# Patient Record
Sex: Female | Born: 1950 | Race: White | Hispanic: No | Marital: Married | Smoking: Never smoker
Health system: Southern US, Community
[De-identification: ages and names within clinical notes are randomized; demographics above are authoritative.]

## PROBLEM LIST (undated history)

## (undated) DIAGNOSIS — E7800 Pure hypercholesterolemia, unspecified: Secondary | ICD-10-CM

## (undated) DIAGNOSIS — H409 Unspecified glaucoma: Secondary | ICD-10-CM

## (undated) DIAGNOSIS — L409 Psoriasis, unspecified: Secondary | ICD-10-CM

## (undated) DIAGNOSIS — M8580 Other specified disorders of bone density and structure, unspecified site: Secondary | ICD-10-CM

## (undated) HISTORY — DX: Psoriasis, unspecified: L40.9

## (undated) HISTORY — PX: COLONOSCOPY: SHX174

## (undated) HISTORY — DX: Pure hypercholesterolemia, unspecified: E78.00

## (undated) HISTORY — PX: OTHER SURGICAL HISTORY: SHX169

## (undated) HISTORY — DX: Other specified disorders of bone density and structure, unspecified site: M85.80

## (undated) HISTORY — DX: Unspecified glaucoma: H40.9

---

## 2004-12-08 ENCOUNTER — Emergency Department: Payer: Self-pay | Admitting: Internal Medicine

## 2005-06-23 ENCOUNTER — Inpatient Hospital Stay: Payer: Self-pay | Admitting: Internal Medicine

## 2005-07-03 ENCOUNTER — Inpatient Hospital Stay: Payer: Self-pay | Admitting: Internal Medicine

## 2005-07-03 IMAGING — CR DG CHEST 1V PORT
1 series · 1 of 1 positions shown · non-contrast
Comparison: none

REASON FOR EXAM: Chest pain
COMMENTS:

[view not recorded]
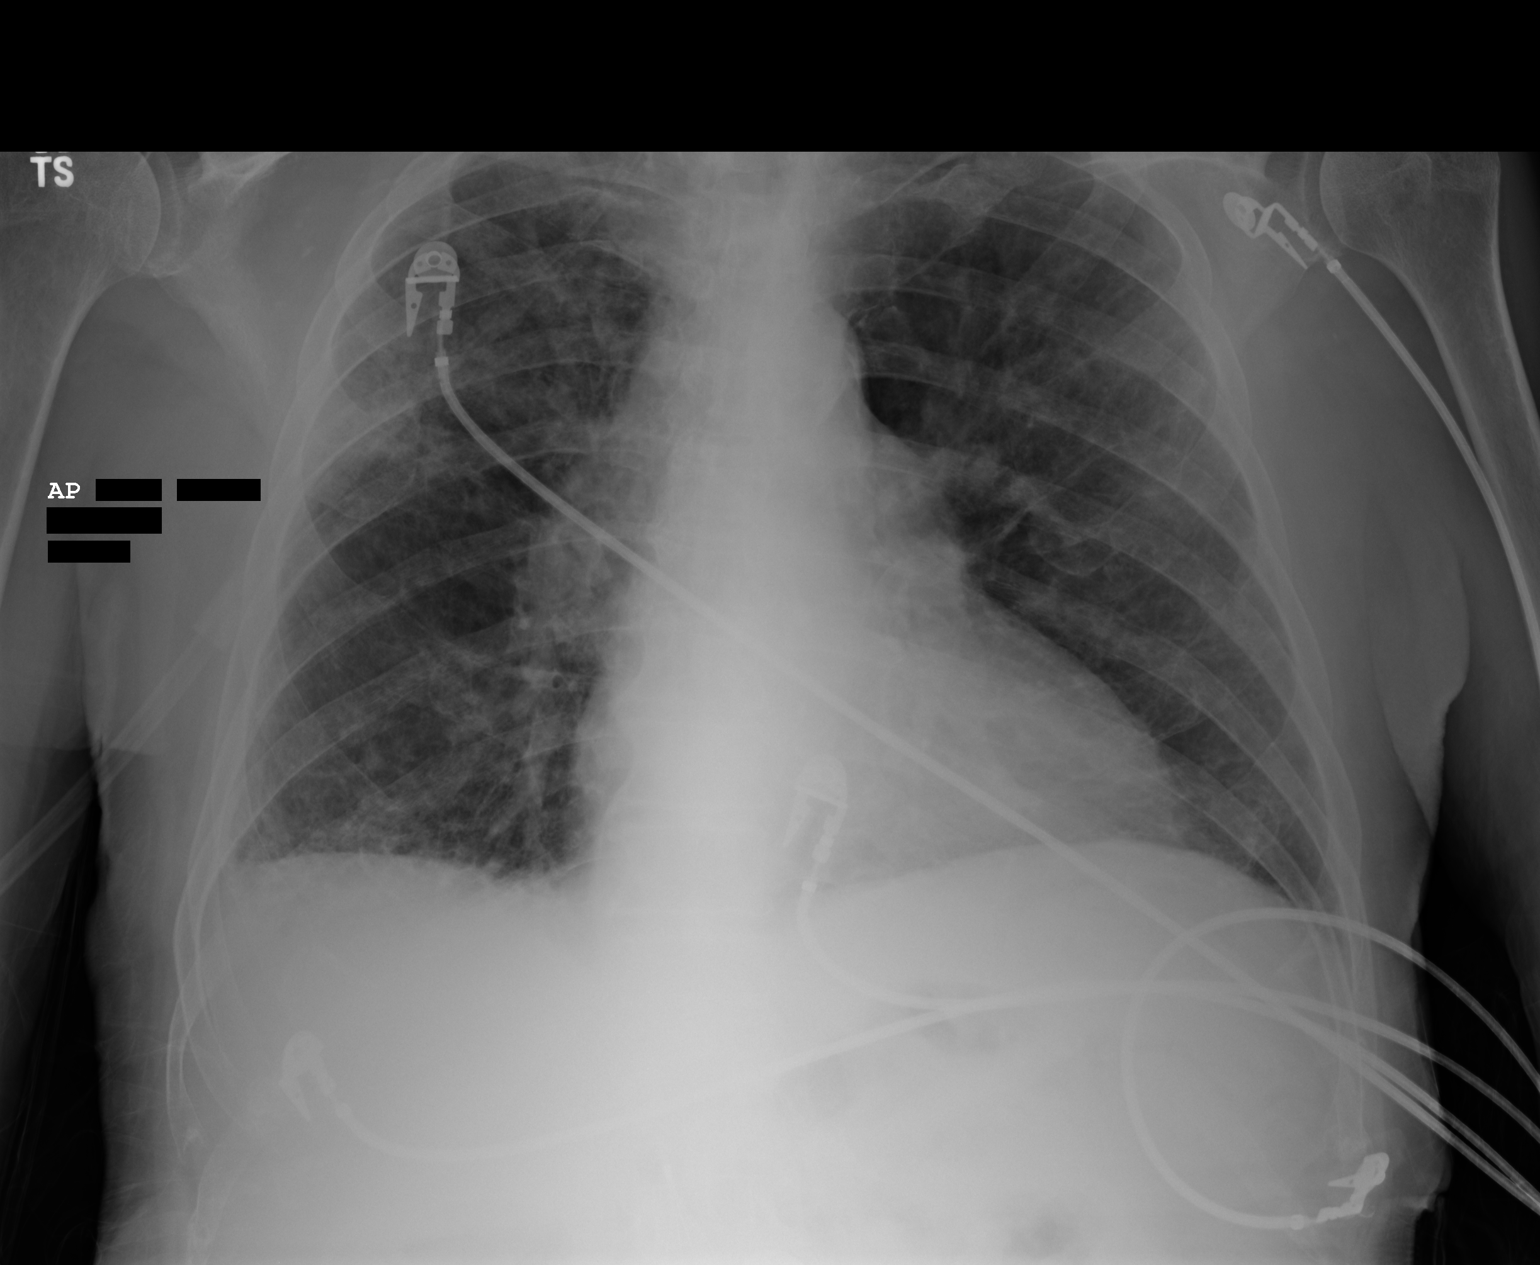

[1 of 1 positions shown; findings below may reference images not displayed]

PROCEDURE:     DXR - DXR PORTABLE CHEST SINGLE VIEW  - [DATE] [DATE]

RESULT:          Comparison is made to a study [DATE].

The lungs are well expanded.  The interstitial markings are less prominent
today, but they remain increased.  The cardiopericardial silhouette is
enlarged but stable.  The pulmonary vascularity is mildly prominent.  There
is no pleural effusion or pneumothorax.
IMPRESSION: There has been some improvement in the appearance of
the pulmonary interstitium since the prior study, but the findings likely
reflect an element of low-grade congestive heart failure superimposed upon
COPD.  There does appear to be some tortuosity of the descending thoracic
aorta.  Continued followup films are recommended.

## 2005-09-12 ENCOUNTER — Ambulatory Visit: Payer: Self-pay | Admitting: Family Medicine

## 2009-06-01 DIAGNOSIS — M8580 Other specified disorders of bone density and structure, unspecified site: Secondary | ICD-10-CM

## 2009-06-01 HISTORY — DX: Other specified disorders of bone density and structure, unspecified site: M85.80

## 2011-05-14 DIAGNOSIS — H409 Unspecified glaucoma: Secondary | ICD-10-CM

## 2011-05-14 HISTORY — DX: Unspecified glaucoma: H40.9

## 2018-03-06 ENCOUNTER — Ambulatory Visit (INDEPENDENT_AMBULATORY_CARE_PROVIDER_SITE_OTHER): Payer: Medicare Other | Admitting: Certified Nurse Midwife

## 2018-03-06 ENCOUNTER — Encounter: Payer: Self-pay | Admitting: Certified Nurse Midwife

## 2018-03-06 ENCOUNTER — Other Ambulatory Visit (HOSPITAL_COMMUNITY)
Admission: RE | Admit: 2018-03-06 | Discharge: 2018-03-06 | Disposition: A | Discharge status: Home / Self Care | Payer: Medicare Other | Source: Ambulatory Visit | Attending: Obstetrics and Gynecology | Admitting: Obstetrics and Gynecology

## 2018-03-06 VITALS — BP 100/60 | HR 72 | Ht 65.0 in | Wt 125.0 lb

## 2018-03-06 DIAGNOSIS — Z01419 Encounter for gynecological examination (general) (routine) without abnormal findings: Secondary | ICD-10-CM

## 2018-03-06 DIAGNOSIS — D126 Benign neoplasm of colon, unspecified: Secondary | ICD-10-CM | POA: Insufficient documentation

## 2018-03-06 DIAGNOSIS — Z124 Encounter for screening for malignant neoplasm of cervix: Secondary | ICD-10-CM

## 2018-03-06 DIAGNOSIS — Z1239 Encounter for other screening for malignant neoplasm of breast: Secondary | ICD-10-CM

## 2018-03-06 NOTE — Progress Notes (Signed)
Gynecology Annual Exam  PCP: Juluis Pitch, MD  Chief Complaint:  Chief Complaint  Patient presents with  . Gynecologic Exam    wants flu shot    History of Present Illness:Pamela Mendoza is a 67 year old Caucasian/White female, G3 P3003, who presents for her annual exam. She is having no significant GYN problems.  Her menses are absent and she is postmenopausal. She does not have hot flashes, night sweats, and vaginal dryness. She has had no spotting.   The patient's past medical history is notable for a history of hypercholesterolemia, osteopenia, and psoriasis.  Since her last annual GYN exam date. A reped 09/05/2015, she has had had two colonoscopies. Her colonoscopy 08/05/2015 she had a tubular adenomatous polyp with high grade dysplasia. She had another colonoscopy six months later in Sept 2019 and was advised to have her next colonoscopy in 3 years. She is not sexually active.  Her most recent pap smear was obtained 09/05/2015 and was negative.  Her most recent mammogram obtained on 09/05/15 was normal.  There is no family history of breast cancer.  There is no family history of ovarian cancer.  The patient does not do monthly self breast exams.   She had a recent DEXA scan obtained in 09/07/2014 that showed osteopenia of the spine(T score-1.5)  and femoral neck (T score -1.3). Plan next Dexa 2021 The patient does not smoke.  The patient does drink alcohol occasionally The patient does not use illegal drugs.  The patient exercises regularly.  The patient does not get adequate calcium in her diet, so she takes calcium supplements.  She had a recent cholesterol screen in 2019 that was OK on her statin.     Review of Systems: Review of Systems  Constitutional: Negative for chills, fever and weight loss.  HENT: Negative for congestion, sinus pain and sore throat.   Eyes: Negative for blurred vision and pain.  Respiratory: Negative for hemoptysis, shortness of breath  and wheezing.   Cardiovascular: Negative for chest pain, palpitations and leg swelling.       Positive for irregular heartbeat  Gastrointestinal: Negative for abdominal pain, blood in stool, diarrhea, heartburn, nausea and vomiting.  Genitourinary: Negative for dysuria, frequency, hematuria and urgency.  Musculoskeletal: Negative for back pain, joint pain and myalgias.  Skin: Negative for itching and rash.  Neurological: Negative for dizziness, tingling and headaches.  Endo/Heme/Allergies: Negative for environmental allergies and polydipsia. Does not bruise/bleed easily.       Negative for hirsutism   Psychiatric/Behavioral: Negative for depression. The patient is not nervous/anxious and does not have insomnia.     Past Medical History:  Past Medical History:  Diagnosis Date  . Glaucoma (increased eye pressure) 2013  . Hypercholesterolemia   . Osteopenia 06/01/2009   L1-L4, FEMORAL NECK INC DENSITY ON 06/2011  . Psoriasis     Past Surgical History:  Past Surgical History:  Procedure Laterality Date  . COLONOSCOPY  2012; 2019   WNL; Mar and Sept 2019 - one tubular adenoma with high grade dysplasia - rept 83yrs  . VERICOSE VEIN STRIPPING      Family History:  Family History  Problem Relation Age of Onset  . Alzheimer's disease Mother   . Osteoporosis Mother   . Diabetes Father   . Hypertension Father   . Hypertension Brother   . Cancer Brother 67       PROSTATE  . Cancer Maternal Grandmother 30  STOMACH    Social History:  Social History   Socioeconomic History  . Marital status: Married    Spouse name: Marcello Moores  . Number of children: 3  . Years of education: 43  . Highest education level: Not on file  Occupational History  . Occupation: RETIRED  Social Needs  . Financial resource strain: Not on file  . Food insecurity:    Worry: Not on file    Inability: Not on file  . Transportation needs:    Medical: Not on file    Non-medical: Not on file  Tobacco  Use  . Smoking status: Never Smoker  . Smokeless tobacco: Never Used  Substance and Sexual Activity  . Alcohol use: Yes    Frequency: Never    Comment: occ  . Drug use: Never  . Sexual activity: Not Currently    Birth control/protection: Post-menopausal  Lifestyle  . Physical activity:    Days per week: Not on file    Minutes per session: Not on file  . Stress: Not on file  Relationships  . Social connections:    Talks on phone: Not on file    Gets together: Not on file    Attends religious service: Not on file    Active member of club or organization: Not on file    Attends meetings of clubs or organizations: Not on file    Relationship status: Not on file  . Intimate partner violence:    Fear of current or ex partner: Not on file    Emotionally abused: Not on file    Physically abused: Not on file    Forced sexual activity: Not on file  Other Topics Concern  . Not on file  Social History Narrative  . Not on file    Allergies:  No Known Allergies  Medications:  Current Outpatient Medications on File Prior to Visit  Medication Sig Dispense Refill  . atorvastatin (LIPITOR) 20 MG tablet Take 1 tablet by mouth daily.    . Calcium Carb-Cholecalciferol (CALCIUM 500 + D3) 500-200 MG-UNIT TABS Take 1 tablet by mouth daily.    Marland Kitchen latanoprost (XALATAN) 0.005 % ophthalmic solution Place 1 drop into both eyes at bedtime.    Marland Kitchen loratadine (CLARITIN) 10 MG tablet Take 1 tablet by mouth daily.    . Multiple Vitamin (MULTI-VITAMINS) TABS Take 1 tablet by mouth daily.    . Omega-3 Fatty Acids (FISH OIL) 1000 MG CAPS Take 1 capsule by mouth daily.     No current facility-administered medications on file prior to visit.        Physical Exam Vitals: BP 100/60   Pulse 72   Ht 5\' 5"  (1.651 m)   Wt 125 lb (56.7 kg)   BMI 20.80 kg/m   General: WF in NAD HEENT: normocephalic, anicteric Neck: no thyroid enlargement, no palpable nodules, no cervical lymphadenopathy  Pulmonary: No  increased work of breathing, CTAB Cardiovascular: RRR, without murmur  Breast: Breast symmetrical, no tenderness, no palpable nodules or masses, no skin or nipple retraction present, no nipple discharge.  No axillary, infraclavicular or supraclavicular lymphadenopathy. Abdomen: Soft, non-tender, non-distended.  Umbilicus without lesions.  No hepatomegaly or masses palpable. No evidence of hernia. Genitourinary:  External: atrophic changes  Normal urethral meatus, normal Bartholin's and Skene's glands.    Vagina: flattened rugae, no evidence of prolapse.    Cervix: Grossly normal in appearance, no bleeding, non-tender  Uterus: Anteverted, normal size, shape, and consistency, mobile, and non-tender  Adnexa: No  adnexal masses, non-tender  Rectal: deferred  Lymphatic: no evidence of inguinal lymphadenopathy Extremities: no edema, erythema, or tenderness Neurologic: Grossly intact Psychiatric: mood appropriate, affect full     Assessment: 67 y.o. G3P3003 annual gyn exam  Plan:   1) Breast cancer screening - recommend monthly self breast exam and mammograms every other year.Stanton Kidney to call [facility] to schedule screening mammogram  2) Colon cancer screening: repeat colonoscopy in 3 years.  3) Cervical cancer screening - Pap was done. ASCCP guidelines and rational discussed.  Patient opts for every 2-3 years screening interval  4) Osteoporosis prevention: continue current calcium and vitamin D supplementation and exercise program. Plan nexr DEXA in 2021  5) Routine healthcare maintenance including cholesterol and diabetes screening managed by PCP. Discussed high dose flu vaccine for people over age 51. We do not carry this here at [facility]. Will get high dose Fluzone from pharmacy  6) RTO in 1-2 years for annual.  Dalia Heading, CNM

## 2018-03-07 ENCOUNTER — Encounter: Payer: Self-pay | Admitting: Certified Nurse Midwife

## 2018-03-07 DIAGNOSIS — L409 Psoriasis, unspecified: Secondary | ICD-10-CM | POA: Insufficient documentation

## 2018-03-07 DIAGNOSIS — H409 Unspecified glaucoma: Secondary | ICD-10-CM | POA: Insufficient documentation

## 2018-03-07 DIAGNOSIS — E785 Hyperlipidemia, unspecified: Secondary | ICD-10-CM | POA: Insufficient documentation

## 2018-03-07 DIAGNOSIS — M8580 Other specified disorders of bone density and structure, unspecified site: Secondary | ICD-10-CM | POA: Insufficient documentation

## 2018-03-10 LAB — CYTOLOGY - PAP: Diagnosis: NEGATIVE

## 2019-05-14 HISTORY — PX: EYE SURGERY: SHX253

## 2019-06-02 ENCOUNTER — Ambulatory Visit: Payer: Medicare Other | Attending: Internal Medicine

## 2019-06-02 DIAGNOSIS — Z23 Encounter for immunization: Secondary | ICD-10-CM | POA: Insufficient documentation

## 2019-06-02 NOTE — Progress Notes (Signed)
   Covid-19 Vaccination Clinic  Name:  Megnan Earnhardt    MRN: [id number] DOB: [birth year]  06/02/2019  Ms. Sessums was observed post Covid-19 immunization for 15 minutes without incidence. She was provided with Vaccine Information Sheet and instruction to access the V-Safe system.   Ms. Proske was instructed to call 911 with any severe reactions post vaccine: Marland Kitchen Difficulty breathing  . Swelling of your face and throat  . A fast heartbeat  . A bad rash all over your body  . Dizziness and weakness    Immunizations Administered    Name Date Dose VIS Date Route   Pfizer COVID-19 Vaccine 06/02/2019 12:59 PM 0.3 mL 04/23/2019 Intramuscular   Manufacturer: [place]   Lot: [id number]   [place]: [id number]

## 2019-06-20 ENCOUNTER — Ambulatory Visit: Payer: Medicare Other | Attending: Internal Medicine

## 2019-06-20 DIAGNOSIS — Z23 Encounter for immunization: Secondary | ICD-10-CM | POA: Insufficient documentation

## 2019-06-20 NOTE — Progress Notes (Signed)
   Covid-19 Vaccination Clinic  Name:  Susana Regier    MRN: [id number] DOB: 12/10/1950  06/20/2019  Ms. [place] was observed post Covid-19 immunization for 15 minutes without incidence. She was provided with Vaccine Information Sheet and instruction to access the V-Safe system.   Ms. Austen was instructed to call 911 with any severe reactions post vaccine: Marland Kitchen Difficulty breathing  . Swelling of your face and throat  . A fast heartbeat  . A bad rash all over your body  . Dizziness and weakness    Immunizations Administered    Name Date Dose VIS Date Route   Pfizer COVID-19 Vaccine 06/20/2019  8:09 AM 0.3 mL 04/23/2019 Intramuscular   Manufacturer: [place]   Lot: [id number]   [place]: [id number]

## 2019-10-24 ENCOUNTER — Emergency Department: Payer: Medicare Other

## 2019-10-24 ENCOUNTER — Encounter: Payer: Self-pay | Admitting: Radiology

## 2019-10-24 ENCOUNTER — Emergency Department
Admission: EM | Admit: 2019-10-24 | Discharge: 2019-10-24 | Disposition: A | Discharge status: Home / Self Care | Payer: Medicare Other | Attending: Emergency Medicine | Admitting: Emergency Medicine

## 2019-10-24 ENCOUNTER — Other Ambulatory Visit: Payer: Self-pay

## 2019-10-24 DIAGNOSIS — Z79899 Other long term (current) drug therapy: Secondary | ICD-10-CM | POA: Insufficient documentation

## 2019-10-24 DIAGNOSIS — R1084 Generalized abdominal pain: Secondary | ICD-10-CM | POA: Diagnosis not present

## 2019-10-24 DIAGNOSIS — R109 Unspecified abdominal pain: Secondary | ICD-10-CM | POA: Diagnosis present

## 2019-10-24 DIAGNOSIS — K5900 Constipation, unspecified: Secondary | ICD-10-CM | POA: Diagnosis not present

## 2019-10-24 LAB — URINALYSIS, COMPLETE (UACMP) WITH MICROSCOPIC
Bilirubin Urine: NEGATIVE
Glucose, UA: NEGATIVE mg/dL
Hgb urine dipstick: NEGATIVE
Ketones, ur: NEGATIVE mg/dL
Leukocytes,Ua: NEGATIVE
Nitrite: NEGATIVE
Protein, ur: NEGATIVE mg/dL
Specific Gravity, Urine: 1.025 (ref 1.005–1.030)
Squamous Epithelial / HPF: NONE SEEN (ref 0–5)
pH: 5 (ref 5.0–8.0)

## 2019-10-24 LAB — COMPREHENSIVE METABOLIC PANEL
ALT: 24 U/L (ref 0–44)
AST: 26 U/L (ref 15–41)
Albumin: 3.4 g/dL — ABNORMAL LOW (ref 3.5–5.0)
Alkaline Phosphatase: 62 U/L (ref 38–126)
Anion gap: 10 (ref 5–15)
BUN: 15 mg/dL (ref 8–23)
CO2: 27 mmol/L (ref 22–32)
Calcium: 8.9 mg/dL (ref 8.9–10.3)
Chloride: 104 mmol/L (ref 98–111)
Creatinine, Ser: 0.72 mg/dL (ref 0.44–1.00)
GFR calc Af Amer: >60 mL/min (ref >60)
GFR calc non Af Amer: >60 mL/min (ref >60)
Glucose, Bld: 162 mg/dL — ABNORMAL HIGH (ref 70–99)
Potassium: 3.5 mmol/L (ref 3.5–5.1)
Sodium: 141 mmol/L (ref 135–145)
Total Bilirubin: 0.5 mg/dL (ref 0.3–1.2)
Total Protein: 6.5 g/dL (ref 6.5–8.1)

## 2019-10-24 LAB — TROPONIN I (HIGH SENSITIVITY): Troponin I (High Sensitivity): 3 ng/L (ref <18)

## 2019-10-24 LAB — LIPASE, BLOOD: Lipase: 29 U/L (ref 11–51)

## 2019-10-24 LAB — CBC
HCT: 34.6 % — ABNORMAL LOW (ref 36.0–46.0)
Hemoglobin: 11.7 g/dL — ABNORMAL LOW (ref 12.0–15.0)
MCH: 29.9 pg (ref 26.0–34.0)
MCHC: 33.8 g/dL (ref 30.0–36.0)
MCV: 88.5 fL (ref 80.0–100.0)
Platelets: 288 10*3/uL (ref 150–400)
RBC: 3.91 MIL/uL (ref 3.87–5.11)
RDW: 13.1 % (ref 11.5–15.5)
WBC: 11.4 10*3/uL — ABNORMAL HIGH (ref 4.0–10.5)
nRBC: 0 % (ref 0.0–0.2)

## 2019-10-24 MED ORDER — IOHEXOL 9 MG/ML PO SOLN
500.0000 mL | Freq: Two times a day (BID) | ORAL | Status: DC | PRN
  Administered 2019-10-24: 500 mL via ORAL

## 2019-10-24 MED ORDER — IOHEXOL 300 MG/ML  SOLN
75.0000 mL | Freq: Once | INTRAMUSCULAR | Status: AC | PRN
  Administered 2019-10-24: 75 mL via INTRAVENOUS

## 2019-10-24 NOTE — ED Notes (Signed)
Pt finished with oral CT contrast. CT called to get the pt at this time.

## 2019-10-24 NOTE — ED Triage Notes (Signed)
Woke this morning with abdominal cramping and dry heaves.

## 2019-10-24 NOTE — ED Provider Notes (Signed)
-----------------------------------------   7:16 AM on 10/24/2019 -----------------------------------------  Blood pressure 113/78, pulse 61, temperature 98 F (36.7 C), temperature source Oral, resp. rate 13, height 5\' 5"  (1.651 m), weight 55.3 kg, SpO2 100 %.  Assuming care from Dr. Karma Greaser.  In short, Pamela Mendoza is a 69 y.o. female with a chief complaint of Abdominal Pain .  Refer to the original H&P for additional details.  The current plan of care is to follow-up CT results.  ----------------------------------------- 8:23 AM on 10/24/2019 -----------------------------------------  CT scan was remarkable only for constipation with moderate stool burden, which could be contributing to patient's symptoms.  Pain remains resolved at this time and she is appropriate for discharge home.  She was counseled on over-the-counter constipation medications and advised to follow-up with her PCP, otherwise return to the ED for new worsening symptoms.  Patient agrees with plan.    Blake Divine, MD 10/24/19 [phone]

## 2019-10-24 NOTE — ED Provider Notes (Signed)
[facility] Emergency Department Provider Note  ____________________________________________   First MD Initiated Contact with Patient 10/24/19 [phone]     (approximate)  I have reviewed the triage vital signs and the nursing notes.   HISTORY  Chief Complaint Abdominal Pain    HPI Pamela Mendoza is a 69 y.o. female who is generally healthy for her age with no chronic medical conditions likely to be contributory.  She has no history of abdominal surgeries.  She presents by private vehicle for evaluation of acute onset and severe dull abdominal pain and cramping that continued for a couple of hours before resolving just after coming to the emergency department.  The pain waxed and waned and nothing in particular made it better or worse.  It started acutely, waking her from sleep, and she went to the bathroom to try to have a bowel movement.  She had a little bit of normal stool, not diarrhea not constipation.  She says that she has been regular recently in terms of her bowel habits with one normal bowel movement per day.  Yesterday she felt a little bit bad all over but without any specific symptoms.  The day prior she had an acute onset of dry heaves but no persistent nausea or vomiting before or after.  This is the first day with severe abdominal pain.  She describes it as being located around her bellybutton or just above and occasionally radiating through to the back.  She is currently asymptomatic.         Past Medical History:  Diagnosis Date  . Glaucoma (increased eye pressure) 2013  . Hypercholesterolemia   . Osteopenia 06/01/2009   L1-L4, FEMORAL NECK INC DENSITY ON 06/2011  . Psoriasis     Patient Active Problem List   Diagnosis Date Noted  . Glaucoma 03/07/2018  . Psoriasis 03/07/2018  . Hyperlipidemia 03/07/2018  . Osteopenia 03/07/2018  . Adenomatous colon polyp 03/06/2018    Past Surgical History:  Procedure Laterality Date  . COLONOSCOPY   2012; 2019   WNL; Mar and Sept 2019 - one tubular adenoma with high grade dysplasia - rept 60yrs  . [place]      Prior to Admission medications   Medication Sig Start Date End Date Taking? Authorizing Provider  atorvastatin (LIPITOR) 20 MG tablet Take 1 tablet by mouth daily. 10/14/17   [provider]  Calcium Carb-Cholecalciferol (CALCIUM 500 + D3) 500-200 MG-UNIT TABS Take 1 tablet by mouth daily.    [provider]  latanoprost (XALATAN) 0.005 % ophthalmic solution Place 1 drop into both eyes at bedtime. 10/20/16   [provider]  loratadine (CLARITIN) 10 MG tablet Take 1 tablet by mouth daily.    [provider]  Multiple Vitamin (MULTI-VITAMINS) TABS Take 1 tablet by mouth daily.    [provider]  Omega-3 Fatty Acids (FISH OIL) 1000 MG CAPS Take 1 capsule by mouth daily.    [provider]    Allergies Patient has no known allergies.  Family History  Problem Relation Age of Onset  . Alzheimer's disease Mother   . Osteoporosis Mother   . Diabetes Father   . Hypertension Father   . Hypertension Brother   . Cancer Brother 27       PROSTATE  . Cancer Maternal Grandmother 31       STOMACH    Social History Social History   Tobacco Use  . Smoking status: Never Smoker  . Smokeless tobacco:  Never Used  Vaping Use  . Vaping Use: Never used  Substance Use Topics  . Alcohol use: Yes    Comment: occ  . Drug use: Never    Review of Systems Constitutional: Chills in the setting of the acute abdominal discomfort but no fever. Eyes: No visual changes. ENT: No sore throat. Cardiovascular: Denies chest pain. Respiratory: Denies shortness of breath. Gastrointestinal: Abdominal pain and cramping.  One episode of nausea and vomiting tonight and one episode of "dry heaves" 2 days ago.  No reported diarrhea nor constipation with regular bowel movements daily. Genitourinary: Negative for dysuria. Musculoskeletal:  Negative for neck pain.  Negative for back pain although some of the abdominal cramping was radiating through to her back. Integumentary: Negative for rash. Neurological: Negative for headaches, focal weakness or numbness.   ____________________________________________   PHYSICAL EXAM:  VITAL SIGNS: ED Triage Vitals [10/24/19 0502]  Enc Vitals Group     BP (!) 101/36     Pulse Rate (!) 56     Resp 18     Temp 98 F (36.7 C)     Temp Source Oral     SpO2 100 %     Weight 55.3 kg (122 lb)     Height 1.651 m (5\' 5" )     Head Circumference      Peak Flow      Pain Score      Pain Loc      Pain Edu?      Excl. in [place]?     Constitutional: Alert and oriented.  Eyes: Conjunctivae are normal.  Head: Atraumatic. Nose: No congestion/rhinnorhea. Mouth/Throat: Patient is wearing a mask. Neck: No stridor.  No meningeal signs.   Cardiovascular: Normal rate, regular rhythm. Good peripheral circulation. Grossly normal heart sounds. Respiratory: Normal respiratory effort.  No retractions. Gastrointestinal: Thin and appropriate body habitus.  Abdomen is soft and nontender including periumbilical, right lower quadrant, epigastric, and right upper quadrants with negative Murphy sign.. No distention.  Musculoskeletal: No lower extremity tenderness nor edema. No gross deformities of extremities. Neurologic:  Normal speech and language. No gross focal neurologic deficits are appreciated.  Skin:  Skin is warm, dry and intact. Psychiatric: Mood and affect are normal. Speech and behavior are normal.  ____________________________________________   LABS (all labs ordered are listed, but only abnormal results are displayed)  Labs Reviewed  COMPREHENSIVE METABOLIC PANEL - Abnormal; Notable for the following components:      Result Value   Glucose, Bld 162 (*)    Albumin 3.4 (*)    All other components within normal limits  CBC - Abnormal; Notable for the following components:   WBC 11.4 (*)      Hemoglobin 11.7 (*)    HCT 34.6 (*)    All other components within normal limits  URINALYSIS, COMPLETE (UACMP) WITH MICROSCOPIC - Abnormal; Notable for the following components:   Color, Urine AMBER (*)    APPearance HAZY (*)    Bacteria, UA RARE (*)    All other components within normal limits  LIPASE, BLOOD  TROPONIN I (HIGH SENSITIVITY)   ____________________________________________  EKG  No indication for emergent EKG ____________________________________________  RADIOLOGY I, Hinda Kehr, personally viewed and evaluated these images (plain radiographs) as part of my medical decision making, as well as reviewing the written report by the radiologist.  ED MD interpretation: CT abdomen pelvis in process at the time of signout.  Official radiology report(s): No results found.  ____________________________________________   PROCEDURES  Procedure(s) performed (including Critical Care):  Procedures   ____________________________________________   INITIAL IMPRESSION / MDM / ASSESSMENT AND PLAN / ED COURSE  As part of my medical decision making, I reviewed the following data within the [facility] notes reviewed and incorporated, Labs reviewed , Old chart reviewed, Patient signed out to Dr. Charna Archer, Radiograph reviewed  and Notes from prior ED visits   Differential diagnosis includes, but is not limited to, constipation, SBO/ileus, appendicitis, diverticulitis, biliary colic, renal/ureteral colic, UTI, less likely aortic dissection or ACS.  The patient has reassuring vital signs and is currently asymptomatic.  Blood work is pending including a CBC, urinalysis, lipase, comprehensive metabolic panel, and a troponin.  The patient voiced some concerns about her symptoms being an atypical ACS presentation I think it is reasonable to check an EKG and a troponin but I think this is unlikely to be the cause of her symptoms at this time.  We talked about  constipation and stool retention being, in my opinion, the most likely diagnosis, but I will reassess the patient after the lab work is back to determine if she would benefit from a CT scan or other imaging.  She could also have early diverticulitis that should be identified on CT scan.  She understands and agrees with the plan.     Clinical Course as of Oct 24 747  Sun Oct 24, 2019  0541 Normal CMP  Comprehensive metabolic panel(!) [CF]  7741 CBC essentially normal, only very mild leukocytosis of 11.4.  CBC(!) [CF]  [id number] Normal troponin  Troponin I (High Sensitivity): 3 [CF]  0744 Reassuring urinalysis  Urinalysis, Complete w Microscopic(!) [CF]  [id number] The patient still feels much better.  However given the severity of the pain and the acute onset as well as the patient's age and concern over the abnormality of the symptoms for her, we discussed it and decided to proceed with a CT scan of the abdomen and pelvis with oral and IV contrast.  She is comfortable with the plan for discharge home if the CT scan is reassuring with no evidence of any emergent medical condition.  I have transferring ED care to Dr. Charna Archer to follow-up the results of the CT scan and to reassess the patient.   [CF]    Clinical Course User Index [CF] Hinda Kehr, MD     ____________________________________________  FINAL CLINICAL IMPRESSION(S) / ED DIAGNOSES  Final diagnoses:  Generalized abdominal pain     MEDICATIONS GIVEN DURING THIS VISIT:  Medications  iohexol (OMNIPAQUE) 9 MG/ML oral solution 500 mL (500 mLs Oral Contrast Given 10/24/19 0652)  iohexol (OMNIPAQUE) 300 MG/ML solution 75 mL (75 mLs Intravenous Contrast Given 10/24/19 0745)     ED Discharge Orders    None      *Please note:  Pamela Mendoza was evaluated in Emergency Department on 10/24/2019 for the symptoms described in the history of present illness. She was evaluated in the context of the global COVID-19 pandemic, which necessitated  consideration that the patient might be at risk for infection with the SARS-CoV-2 virus that causes COVID-19. Institutional protocols and algorithms that pertain to the evaluation of patients at risk for COVID-19 are in a state of rapid change based on information released by regulatory bodies including the CDC and federal and state organizations. These policies and algorithms were followed during the patient's care in the ED.  Some ED evaluations and interventions may be delayed as a result of limited staffing during the  pandemic.*  Note:  This document was prepared using Dragon voice recognition software and may include unintentional dictation errors.   Hinda Kehr, MD 10/24/19 [phone]

## 2019-10-24 NOTE — Discharge Instructions (Addendum)
You have been seen in the Emergency Department (ED) for abdominal pain.  Your evaluation did not identify a clear cause of your symptoms but was generally reassuring.  Please follow up as instructed above regarding today's emergent visit and the symptoms that are bothering you.  Return to the ED if your abdominal pain worsens or fails to improve, you develop bloody vomiting, bloody diarrhea, you are unable to tolerate fluids due to vomiting, fever greater than 101, or other symptoms that concern you.  You were seen in the emergency department today for constipation.  We recommend that you use one or more of the following over-the-counter medications in the order described:   1)  Colace (or Dulcolax) 100 mg:  This is a stool softener, and you may take it once or twice a day as needed. 2)  Senna tablets:  This is a bowel stimulant that will help "push" out your stool. It is the next step to add after you have tried a stool softener. 3)  Miralax (powder):  This medication works by drawing additional fluid into your intestines and helps to flush out your stool.  Mix the powder with water or juice according to label instructions.  It may help if the Colace and Senna are not sufficient, but you must be sure to use the recommended amount of water or juice when you mix up the powder. 4)  Look for magnesium citrate at the pharmacy (it is usually a small glass bottle).  Drink the bottle according to the label instructions.  Remember that narcotic pain medications are constipating, so avoid them or minimize their use.  Drink plenty of fluids.  Please return to the Emergency Department immediately if you develop new or worsening symptoms that concern you, such as (but not limited to) fever > 101 degrees, severe abdominal pain, or persistent vomiting.

## 2019-10-24 NOTE — ED Notes (Signed)
Patient reports earlier episode of abdominal pain with radiation to jaw and left arm. Patient reports nausea and dry heaving. Patient reports her symptoms have resolved.

## 2020-03-06 ENCOUNTER — Ambulatory Visit: Payer: Medicare Other | Attending: Internal Medicine

## 2020-03-06 DIAGNOSIS — Z23 Encounter for immunization: Secondary | ICD-10-CM

## 2020-03-06 NOTE — Progress Notes (Signed)
   Covid-19 Vaccination Clinic  Name:  Jenet Durio    MRN: [id number] DOB: [birth year]  03/06/2020  Ms. Collazos was observed post Covid-19 immunization for 15 minutes without incident. She was provided with Vaccine Information Sheet and instruction to access the V-Safe system.   Ms. Eakes was instructed to call 911 with any severe reactions post vaccine: Marland Kitchen Difficulty breathing  . Swelling of face and throat  . A fast heartbeat  . A bad rash all over body  . Dizziness and weakness

## 2020-06-14 ENCOUNTER — Other Ambulatory Visit: Payer: Self-pay

## 2020-06-14 ENCOUNTER — Ambulatory Visit (INDEPENDENT_AMBULATORY_CARE_PROVIDER_SITE_OTHER): Payer: Medicare Other | Admitting: Obstetrics and Gynecology

## 2020-06-14 ENCOUNTER — Encounter: Payer: Self-pay | Admitting: Obstetrics and Gynecology

## 2020-06-14 VITALS — BP 100/70 | Ht 65.0 in | Wt 131.0 lb

## 2020-06-14 DIAGNOSIS — Z1231 Encounter for screening mammogram for malignant neoplasm of breast: Secondary | ICD-10-CM | POA: Diagnosis not present

## 2020-06-14 DIAGNOSIS — Z1211 Encounter for screening for malignant neoplasm of colon: Secondary | ICD-10-CM | POA: Diagnosis not present

## 2020-06-14 DIAGNOSIS — Z01419 Encounter for gynecological examination (general) (routine) without abnormal findings: Secondary | ICD-10-CM | POA: Diagnosis not present

## 2020-06-14 NOTE — Progress Notes (Signed)
PCP: Dorothey BasemanBronstein, David, MD   Chief Complaint  Patient presents with  . Gynecologic Exam    Dry skin, has some discomfort under neck-upper back, had blood work recently done at [facility] and has qs    HPI:      Ms. Pamela Mendoza is a 70 y.o. [id number] whose LMP was No LMP recorded. Patient is postmenopausal., presents today for her MEDICARE annual examination.  Her menses are absent due to menopause. No PMB. She does not have vasomotor sx.   Sex activity: not sexually active. She does not have vaginal dryness.  Last Pap: 03/06/18  Results were: no abnormalities  ; no hx of abn paps, no longer indicated  [facility];  Results were: normal--routine follow-up in 12 months There is no FH of breast cancer. There is no FH of ovarian cancer. The patient does not do self-breast exams.  Colonoscopy: 08/05/2015 she had a tubular adenomatous polyp with high grade dysplasia. She had another colonoscopy six months later in Sept 2019 and was advised to have her next colonoscopy in 3 years. Dr[facility] Norm[facility]onoledo [facility].  Tobacco use: The patient denies current or previous tobacco use. Alcohol use: none Exercise: moderately active  [facility]; osteopenia in spine/hip  She does get adequate calcium and Vitamin D in her diet.  Labs with PCP. Hx of pre-DM on 1/22 labs but normal for pt, no recent change.   Has had upper back/neck pain with lying down past few nights. Sx started after sleeping on couch. Using heating pad with some relief to sleep. No sx if up and about all day, notices only when lying down. No known trauma.   Past Medical History:  Diagnosis Date  . Glaucoma (increased eye pressure) 2013  . Hypercholesterolemia   . Osteopenia 06/01/2009   L1-L4, FEMORAL NECK INC DENSITY ON 06/2011  . Psoriasis     Past Surgical History:  Procedure Laterality Date  . COLONOSCOPY  2012; 2019   WNL; Mar and Sept 2019 - one tubular adenoma with high grade dysplasia - rept 25yrs  .  EYE SURGERY  2021  . VERICOSE VEIN STRIPPING      Family History  Problem Relation Age of Onset  . Alzheimer's disease Mother   . Osteoporosis Mother   . Diabetes Father   . Hypertension Father   . Hypertension Brother   . Cancer Brother [phone]       PROSTATE  . Cancer Maternal Grandmother 30       STOMACH    Social History   Socioeconomic History  . Marital status: Married    Spouse name: Maisie Fushomas  . Number of children: 3  . Years of education: 1114  . Highest education level: Not on file  Occupational History  . Occupation: RETIRED  Tobacco Use  . Smoking status: Never Smoker  . Smokeless tobacco: Never Used  Vaping Use  . Vaping Use: Never used  Substance and Sexual Activity  . Alcohol use: Yes    Comment: occ  . Drug use: Never  . Sexual activity: Not Currently    Birth control/protection: Post-menopausal  Other Topics Concern  . Not on file  Social History Narrative  . Not on file   Social Determinants of Health   Financial Resource Strain: Not on file  Food Insecurity: Not on file  Transportation Needs: Not on file  Physical Activity: Not on file  Stress: Not on file  Social Connections: Not on file  Intimate Partner Violence: Not on file     Current Outpatient Medications:  .  atorvastatin (LIPITOR) 10 MG tablet, Take 10 mg by mouth daily., Disp: , Rfl:  .  Calcium Carb-Cholecalciferol 500-200 MG-UNIT TABS, Take 1 tablet by mouth daily., Disp: , Rfl:  .  latanoprost (XALATAN) 0.005 % ophthalmic solution, Place 1 drop into both eyes at bedtime., Disp: , Rfl:  .  loratadine (CLARITIN) 10 MG tablet, Take 1 tablet by mouth daily., Disp: , Rfl:  .  Multiple Vitamin (MULTI-VITAMINS) TABS, Take 1 tablet by mouth daily., Disp: , Rfl:  .  Omega-3 Fatty Acids (FISH OIL) 1000 MG CAPS, Take 1 capsule by mouth daily., Disp: , Rfl:      ROS:  Review of Systems  Constitutional: Negative for fatigue, fever and unexpected weight change.  Respiratory: Negative for  cough, shortness of breath and wheezing.   Cardiovascular: Negative for chest pain, palpitations and leg swelling.  Gastrointestinal: Negative for blood in stool, constipation, diarrhea, nausea and vomiting.  Endocrine: Negative for cold intolerance, heat intolerance and polyuria.  Genitourinary: Negative for dyspareunia, dysuria, flank pain, frequency, genital sores, hematuria, menstrual problem, pelvic pain, urgency, vaginal bleeding, vaginal discharge and vaginal pain.  Musculoskeletal: Positive for back pain. Negative for joint swelling and myalgias.  Skin: Negative for rash.  Neurological: Negative for dizziness, syncope, light-headedness, numbness and headaches.  Hematological: Negative for adenopathy.  Psychiatric/Behavioral: Negative for agitation, confusion, sleep disturbance and suicidal ideas. The patient is not nervous/anxious.    BREAST: No symptoms    Objective: BP 100/70   Ht 5\' 5"  (1.651 m)   Wt 131 lb (59.4 kg)   BMI 21.80 kg/m    Physical Exam Constitutional:      Appearance: She is well-developed.  Genitourinary:     Vulva normal.     Right Labia: No rash, tenderness or lesions.    Left Labia: No tenderness, lesions or rash.    No vaginal discharge, erythema or tenderness.      Right Adnexa: not tender and no mass present.    Left Adnexa: not tender and no mass present.    No cervical friability or polyp.     Uterus is not enlarged or tender.  Breasts:     Right: No mass, nipple discharge, skin change or tenderness.     Left: No mass, nipple discharge, skin change or tenderness.    Neck:     Thyroid: No thyromegaly.  Cardiovascular:     Rate and Rhythm: Normal rate and regular rhythm.     Heart sounds: Normal heart sounds. No murmur heard.   Pulmonary:     Effort: Pulmonary effort is normal.     Breath sounds: Normal breath sounds.  Abdominal:     Palpations: Abdomen is soft.     Tenderness: There is no abdominal tenderness. There is no guarding  or rebound.  Musculoskeletal:        General: Normal range of motion.     Cervical back: Normal range of motion.  Lymphadenopathy:     Cervical: No cervical adenopathy.  Neurological:     General: No focal deficit present.     Mental Status: She is alert and oriented to person, place, and time.     Cranial Nerves: No cranial nerve deficit.  Skin:    General: Skin is warm and dry.  Psychiatric:        Mood and Affect: Mood normal.        Behavior: Behavior  normal.        Thought Content: Thought content normal.        Judgment: Judgment normal.  Vitals reviewed.     Assessment/Plan:  Encounter for annual routine gynecological examination  Encounter for screening mammogram for malignant neoplasm of breast - Plan: MM 3D SCREEN BREAST BILATERAL; pt to sched mammo  Screening for colon cancer--pt to f/u with [facility] 9/22. Will send ref prn.         GYN counsel breast self exam, mammography screening, menopause, adequate intake of calcium and vitamin D, diet and exercise    F/U  Return in about 2 years (around 4/0/7680) for annual.  Michell Kader B. Verda Mehta, PA-C 06/14/2020 10:56 AM

## 2020-06-14 NOTE — Patient Instructions (Signed)
I value your feedback and you entrusting us with your care. If you get a [facility] patient survey, I would appreciate you taking the time to let us know about your experience today. Thank you!   [facility]: [phone]  

## 2020-07-27 ENCOUNTER — Encounter: Payer: Self-pay | Admitting: Obstetrics and Gynecology

## 2021-07-17 NOTE — Progress Notes (Signed)
? ?PCP: Juluis Pitch, MD ? ? ?Chief Complaint  ?Patient presents with  ? Gynecologic Exam  ?  No concerns  ? ? ?HPI: ?     Pamela Mendoza is a 71 y.o. [phone] whose LMP was No LMP recorded. Patient is postmenopausal., presents today for her MEDICARE annual examination.  Her menses are absent due to menopause. No PMB. She does not have vasomotor sx.  ? ?Sex activity: not sexually active. She does not have vaginal dryness/pain/sx. ? ?Last Pap: 03/06/18  Results were: no abnormalities; no hx of abn paps, no longer indicated ? ?Last mammogram: 07/25/20 at [facility];  Results were: normal--routine follow-up in 12 months. Has appt next wk. ?There is no FH of breast cancer. There is no FH of ovarian cancer. The patient does not do self-breast exams. ? ?Colonoscopy: 1/23 at [facility]: repeat due after 5 yrs. Hx of tubular adenomatous polyp with high grade dysplasia 2017, with repeat 2019.  ? ?Tobacco use: The patient denies current or previous tobacco use. ?Alcohol use: none ?No drug use ?Exercise: moderately active ? ?DEXA: 1/21 at [place]; osteopenia in spine/hip; repeat due, not ordered yet by PCP ? ?She does get adequate calcium and Vitamin D in her diet. ? ?Labs with PCP.  ? ?Past Medical History:  ?Diagnosis Date  ? Glaucoma (increased eye pressure) 2013  ? Hypercholesterolemia   ? Osteopenia 06/01/2009  ? L1-L4, FEMORAL NECK INC DENSITY ON 06/2011  ? Psoriasis   ? ? ?Past Surgical History:  ?Procedure Laterality Date  ? COLONOSCOPY  2012; 2019  ? WNL; Mar and Sept 2019 - one tubular adenoma with high grade dysplasia - rept 73yr  ? [place]   ? ? ?Family History  ?Problem Relation Age of Onset  ? Alzheimer's disease Mother   ? Osteoporosis Mother   ? Diabetes Father   ? Hypertension Father   ? Hypertension Brother   ? Cancer Brother 570 ?     PROSTATE  ? Cancer Maternal Grandmother 30  ?     STOMACH  ? ? ?Social History  ? ?Socioeconomic History  ? Marital status: Married  ?  Spouse name:  TMarcello Moores ? Number of children: 3  ? Years of education: 167 ? Highest education level: Not on file  ?Occupational History  ? Occupation: RETIRED  ?Tobacco Use  ? Smoking status: Never  ? Smokeless tobacco: Never  ?Vaping Use  ? Vaping Use: Never used  ?Substance and Sexual Activity  ? Alcohol use: Yes  ?  Comment: occ  ? Drug use: Never  ? Sexual activity: Not Currently  ?  Birth control/protection: Post-menopausal  ?Other Topics Concern  ? Not on file  ?Social History Narrative  ? Not on file  ? ?Social Determinants of Health  ? ?Financial Resource Strain: Not on file  ?Food Insecurity: Not on file  ?Transportation Needs: Not on file  ?Physical Activity: Not on file  ?Stress: Not on file  ?Social Connections: Not on file  ?Intimate Partner Violence: Not on file  ? ? ? ?Current Outpatient Medications:  ?  atorvastatin (LIPITOR) 10 MG tablet, Take 10 mg by mouth daily., Disp: , Rfl:  ?  Calcium Carb-Cholecalciferol 500-200 MG-UNIT TABS, Take 1 tablet by mouth daily., Disp: , Rfl:  ?  Coenzyme Q10 10 MG capsule, Take by mouth., Disp: , Rfl:  ?  latanoprost (XALATAN) 0.005 % ophthalmic solution, Place 1 drop into both  eyes at bedtime., Disp: , Rfl:  ?  loratadine (CLARITIN) 10 MG tablet, Take 1 tablet by mouth daily., Disp: , Rfl:  ?  Multiple Vitamin (MULTI-VITAMINS) TABS, Take 1 tablet by mouth daily., Disp: , Rfl:  ?  Omega-3 Fatty Acids (FISH OIL) 1000 MG CAPS, Take 1 capsule by mouth daily., Disp: , Rfl:  ? ? ? ? ?ROS: ? ?Review of Systems  ?Constitutional:  Negative for fatigue, fever and unexpected weight change.  ?Respiratory:  Negative for cough, shortness of breath and wheezing.   ?Cardiovascular:  Negative for chest pain, palpitations and leg swelling.  ?Gastrointestinal:  Negative for blood in stool, constipation, diarrhea, nausea and vomiting.  ?Endocrine: Negative for cold intolerance, heat intolerance and polyuria.  ?Genitourinary:  Negative for dyspareunia, dysuria, flank pain, frequency, genital sores,  hematuria, menstrual problem, pelvic pain, urgency, vaginal bleeding, vaginal discharge and vaginal pain.  ?Musculoskeletal:  Negative for back pain, joint swelling and myalgias.  ?Skin:  Negative for rash.  ?Neurological:  Negative for dizziness, syncope, light-headedness, numbness and headaches.  ?Hematological:  Negative for adenopathy.  ?Psychiatric/Behavioral:  Negative for agitation, confusion, sleep disturbance and suicidal ideas. The patient is not nervous/anxious.   ?BREAST: No symptoms ? ? ? ?Objective: ?BP 90/60   Ht 5' 4.25" (1.632 m)   Wt 126 lb (57.2 kg)   BMI 21.46 kg/m?  ? ? ?Physical Exam ?Constitutional:   ?   Appearance: She is well-developed.  ?Genitourinary:  ?   Vulva normal.  ?   Right Labia: No rash, tenderness or lesions. ?   Left Labia: No tenderness, lesions or rash. ?   No vaginal discharge, erythema or tenderness.  ? ?   Right Adnexa: not tender and no mass present. ?   Left Adnexa: not tender and no mass present. ?   No cervical friability or polyp.  ?   Uterus is not enlarged or tender.  ?Breasts: ?   Right: No mass, nipple discharge, skin change or tenderness.  ?   Left: No mass, nipple discharge, skin change or tenderness.  ?Neck:  ?   Thyroid: No thyromegaly.  ?Cardiovascular:  ?   Rate and Rhythm: Normal rate and regular rhythm.  ?   Heart sounds: Normal heart sounds. No murmur heard. ?Pulmonary:  ?   Effort: Pulmonary effort is normal.  ?   Breath sounds: Normal breath sounds.  ?Abdominal:  ?   Palpations: Abdomen is soft.  ?   Tenderness: There is no abdominal tenderness. There is no guarding or rebound.  ?Musculoskeletal:     ?   General: Normal range of motion.  ?   Cervical back: Normal range of motion.  ?Lymphadenopathy:  ?   Cervical: No cervical adenopathy.  ?Neurological:  ?   General: No focal deficit present.  ?   Mental Status: She is alert and oriented to person, place, and time.  ?   Cranial Nerves: No cranial nerve deficit.  ?Skin: ?   General: Skin is warm and  dry.  ?Psychiatric:     ?   Mood and Affect: Mood normal.     ?   Behavior: Behavior normal.     ?   Thought Content: Thought content normal.     ?   Judgment: Judgment normal.  ?Vitals reviewed.  ? ? ?Assessment/Plan: ?Encounter for annual routine gynecological examination ? ?Encounter for screening mammogram for malignant neoplasm of breast - Plan: MM 3D SCREEN BREAST BILATERAL; pt has mammo appt. ? ?Screening  for osteoporosis - Plan: DG Bone Density, does at [facility].  ?  ?GYN counsel breast self exam, mammography screening, menopause, adequate intake of calcium and vitamin D, diet and exercise ? ?  F/U ? Return in about 2 years (around 07/19/2023) for annual. ? ?Linde Wilensky B. Haelee Bolen, PA-C ?07/18/2021 ?10:45 AM ?

## 2021-07-18 ENCOUNTER — Ambulatory Visit (INDEPENDENT_AMBULATORY_CARE_PROVIDER_SITE_OTHER): Payer: Medicare Other | Admitting: Obstetrics and Gynecology

## 2021-07-18 ENCOUNTER — Other Ambulatory Visit: Payer: Self-pay

## 2021-07-18 ENCOUNTER — Encounter: Payer: Self-pay | Admitting: Obstetrics and Gynecology

## 2021-07-18 VITALS — BP 90/60 | Ht 64.25 in | Wt 126.0 lb

## 2021-07-18 DIAGNOSIS — Z1231 Encounter for screening mammogram for malignant neoplasm of breast: Secondary | ICD-10-CM

## 2021-07-18 DIAGNOSIS — Z01419 Encounter for gynecological examination (general) (routine) without abnormal findings: Secondary | ICD-10-CM

## 2021-07-18 DIAGNOSIS — Z13820 Encounter for screening for osteoporosis: Secondary | ICD-10-CM | POA: Diagnosis not present

## 2021-07-18 NOTE — Patient Instructions (Signed)
I value your feedback and you entrusting us with your care. If you get a [facility] patient survey, I would appreciate you taking the time to let us know about your experience today. Thank you!   [facility]: [phone]  

## 2021-07-31 ENCOUNTER — Encounter: Payer: Self-pay | Admitting: Obstetrics and Gynecology

## 2023-09-24 ENCOUNTER — Other Ambulatory Visit: Payer: Self-pay | Admitting: Family Medicine

## 2023-09-24 ENCOUNTER — Ambulatory Visit
Admission: RE | Admit: 2023-09-24 | Discharge: 2023-09-24 | Disposition: A | Discharge status: Home / Self Care | Source: Ambulatory Visit | Attending: Family Medicine | Admitting: Family Medicine

## 2023-09-24 DIAGNOSIS — R109 Unspecified abdominal pain: Secondary | ICD-10-CM

## 2024-04-02 ENCOUNTER — Ambulatory Visit: Attending: Otolaryngology | Admitting: Speech Pathology

## 2024-04-02 DIAGNOSIS — R490 Dysphonia: Secondary | ICD-10-CM | POA: Diagnosis present

## 2024-04-02 NOTE — Therapy (Signed)
 OUTPATIENT SPEECH LANGUAGE PATHOLOGY  VOICE EVALUATION   Patient Name: Pamela Mendoza MRN: [id number] DOB:1951-05-05, 73 y.o., female Today's Date: 04/02/2024  PCP: Alm Na, MD REFERRING PROVIDER: Carolee Hunter, MD   End of Session - 04/02/24 1521     Visit Number 1    Number of Visits 17    Date for Recertification  05/28/24    Authorization Type Blue Cross Blue Shield    Progress Note Due on Visit 10    SLP Start Time 0800    SLP Stop Time  0845    SLP Time Calculation (min) 45 min    Activity Tolerance Patient tolerated treatment well          Past Medical History:  Diagnosis Date   Glaucoma (increased eye pressure) 2013   Hypercholesterolemia    Osteopenia 06/01/2009   L1-L4, FEMORAL NECK INC DENSITY ON 06/2011   Psoriasis    Past Surgical History:  Procedure Laterality Date   COLONOSCOPY  2012; 2019   WNL; Mar and Sept 2019 - one tubular adenoma with high grade dysplasia - rept 9yrs   EYE SURGERY  2021   VERICOSE VEIN STRIPPING     Patient Active Problem List   Diagnosis Date Noted   Glaucoma 03/07/2018   Psoriasis 03/07/2018   Hyperlipidemia 03/07/2018   Osteopenia 03/07/2018   Adenomatous colon polyp 03/06/2018    ONSET DATE:  ~ several months prior: date of referral 03/09/2024  REFERRING DIAG: R49.0 (ICD-10-CM) - Dysphonia   THERAPY DIAG:  Dysphonia  Rationale for Evaluation and Treatment Rehabilitation  SUBJECTIVE:   SUBJECTIVE STATEMENT: Pt pleasant, good historian Pt accompanied by: self  PERTINENT HISTORY and DIAGNOSTIC FINDINGS: Pt is a 73 year old female who was referred by her ENT, Dr Carolee Hunter, d/t hoarseness that is described as deep and raspy and moderate in severity with associated globus sensation.   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 No   FALLS: Has patient fallen in last 6 months? No,   LIVING ENVIRONMENT: Lives with: lives with their spouse Lives in: House/apartment  PLOF: Independent  PATIENT GOALS    to improve vocal quality  OBJECTIVE:  COGNITION: Overall cognitive status: Within functional limits for tasks assessed  SOCIAL HISTORY: Occupation: retired Counsellor intake: optimal Caffeine/alcohol intake: excessive Daily voice use: excessive Environmental risks: None reported Occupational risks: None identified Misuse: Excessively low pitch, Glottal fry, Strain, and Tension Phonotraumatic behaviors: Speaks in noise and Excessive voice use  PERCEPTUAL VOICE ASSESSMENT: Voice quality: hoarse, breathy, harsh, rough, strained, diplophonia, and vocal fatigue Vocal abuse: abnormal breathing pattern and excessive voice use Resonance: normal Respiratory function: clavicular breathing  OBJECTIVE VOICE ASSESSMENT: Sustained ah maximum phonation time: 4 seconds Sustained ah loudness average: 70 dB Average frequency during sustained "ah":191 Hz   (2 SD below average of  244 Hz +/- 27 for gender)  Oral reading (passage) loudness average: 72 dB Oral reading loudness range: 22 dB Conversational pitch average: 138 Hz Highest dynamic pitch in conversational speech: 184 Hz Lowest dynamic pitch in conversational speech: 102 Hz Conversational pitch range: 82 Hz Conversational  loudness average: 71 dB Conversational loudness range: 19 dB S/z ratio: 1.6 (Suggestive of dysfunction >1.0) Voice quality: hoarse, breathy, harsh, rough, strained, diplophonia, and vocal fatigue      ORAL MOTOR  EXAMINATION Facial : WFL Lingual: WFL Velum: WFL Mandible: WFL Cough: WFL  PATIENT REPORTED OUTCOME MEASURES (PROM):  VOICE HANDICAP INDEX (VHI)  The Voice Handicap Index is comprised of a series of questions to assess the patient's perception of their voice. It is designed to evaluate the emotional, physical and functional components of the voice problem.  Functional: 0 Physical: 9 Emotional: 0 Total: 9 (Normal mean 8.75, SD =14.97)  z score =  0  no significant impact = 0-1.00  TODAY'S TREATMENT:  Extensive education provided that Neck range of motion exercises should be done to the point of feeling a GENTLE, TOLERABLE stretch only. Demonstration provided with pt able to imitate for the following stretches:  Head Tilt: Forward and Back - Gently bow your head and try to touch your chin to your chest. Raise your chin back to the starting position. Tilt your head back as far as possible so you are looking up at the ceiling. Return your head to the starting position. - Independent  Head Tilt: Side to Side: Tilt your head to the side, bringing your ear toward your shoulder. Do not raise your shoulder to your ear. Keep your shoulder still. Return your head to the starting position. Independent   PATIENT EDUCATION: Education details: results of this assessment, ST POC Person educated: Patient Education method: Explanation Education comprehension: needs further education   HOME EXERCISE PROGRAM: As above     GOALS: Goals reviewed with patient? Yes  SHORT TERM GOALS: Target date: 10 sessions  The patient will decrease laryngeal and articulatory muscle tension by independently completing relaxation/stretching exercises.  Baseline: Goal status: INITIAL  2.  The patient will minimize vocal tension via resonant voice therapy (or comparable technique) with min SLP cues with 80% accuracy.  Baseline:  Goal status: INITIAL   LONG TERM GOALS: Target date: 05/28/2024  The patient  will utilize a forward tone focused/resonant voice to decrease vocal hyperfunction and improve voice quality and vocal projection. Baseline:  Goal status: INITIAL  2.  Pt will understand and explain the changes in vocal folds that result from vocal abuse in 80% of opportunities across 3 data sessions.  Baseline:  Goal status: INITIAL  3.  The patient will increase hydration for an eventual goal of 6-8 glasses per day and limit caffeine intake (to maximum of 1-2, 8 oz cups/day), as measured by patient report.  Baseline:  Goal status: INITIAL   ASSESSMENT:  CLINICAL IMPRESSION: Patient is a 73 y.o. female who was seen today for a voice evaluation. 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 Her vocal quality is c/b breathy, low pitch, raspy, rough and strained with vocal fatigue observed as session progressed. This is further complicated by decreased hydration, decreased understanding of LPR (poor PPI compliances a result), excessive vocal use.   OBJECTIVE IMPAIRMENTS include voice disorder. These impairments are limiting patient from effectively communicating at home and in community. Factors affecting potential to achieve goals and functional outcome are untreated LPR. Patient will benefit from skilled SLP services to address above impairments and improve overall function.  REHAB POTENTIAL: Good  PLAN: SLP FREQUENCY: 1-2x/week  SLP DURATION: 8 weeks  PLANNED INTERVENTIONS: SLP instruction and feedback, Compensatory strategies, and Patient/family education   Shanikia [facility] B. Rubbie, M.S.,  CCC-SLP, Warehouse Manager [facility]  [facility] Services Office [phone] Ascom [phone] Fax [phone]

## 2024-04-05 ENCOUNTER — Ambulatory Visit: Admitting: Speech Pathology

## 2024-04-05 DIAGNOSIS — R490 Dysphonia: Secondary | ICD-10-CM | POA: Diagnosis not present

## 2024-04-05 NOTE — Therapy (Signed)
 OUTPATIENT SPEECH LANGUAGE PATHOLOGY  VOICE TREATMENT NOTE    Patient Name: Pamela Mendoza MRN: [id number] DOB:10-Jun-1950, 73 y.o., female Today's Date: 04/05/2024  PCP: Alm Na, MD REFERRING PROVIDER: Carolee Hunter, MD   End of Session - 04/05/24 1521     Visit Number 2   Number of Visits 17    Date for Recertification  05/28/24    Authorization Type [employer] Blue [employer]    Progress Note Due on Visit 10    SLP Start Time 1400    1445   SLP Time Calculation (min) 45 min    Activity Tolerance Patient tolerated treatment well          Past Medical History:  Diagnosis Date   Glaucoma (increased eye pressure) 2013   Hypercholesterolemia    Osteopenia 06/01/2009   L1-L4, FEMORAL NECK INC DENSITY ON 06/2011   Psoriasis    Past Surgical History:  Procedure Laterality Date   COLONOSCOPY  2012; 2019   WNL; Mar and Sept 2019 - one tubular adenoma with high grade dysplasia - rept 31yrs   EYE SURGERY  2021   VERICOSE VEIN STRIPPING     Patient Active Problem List   Diagnosis Date Noted   Glaucoma 03/07/2018   Psoriasis 03/07/2018   Hyperlipidemia 03/07/2018   Osteopenia 03/07/2018   Adenomatous colon polyp 03/06/2018    ONSET DATE:  ~ several months prior: date of referral 03/09/2024  REFERRING DIAG: R49.0 (ICD-10-CM) - Dysphonia   THERAPY DIAG:  Dysphonia  Rationale for Evaluation and Treatment Rehabilitation  SUBJECTIVE:   PERTINENT HISTORY and DIAGNOSTIC FINDINGS: Pt is a 73 year old female who was referred by her ENT, Dr Carolee Hunter, d/t hoarseness that is described as deep and raspy and moderate in severity with associated globus sensation.   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 No   FALLS: Has patient fallen in last 6 months? No,   LIVING ENVIRONMENT: Lives with: lives with their spouse Lives in: House/apartment  PLOF: Independent  PATIENT GOALS    to improve vocal quality  SUBJECTIVE STATEMENT: Pt pleasant, EAGER, I did my homework Pt accompanied by: self  OBJECTIVE:   TODAY'S TREATMENT:  Skilled treatment session targeted pt's dysphonia goals. SLP facilitated session by providing the following interventions:  Skilled verbal and written information provided on LPR (symptomology, causes, compensatory strategies), vocal fold bowing and muscle tension dysphonia.   Extensive education provided that Neck range of motion exercises should be done to the point of feeling a GENTLE, TOLERABLE stretch only. Demonstration provided with pt able to imitate for the following stretches:  Head Tilt: Forward and Back - Gently bow your head and try to touch your chin to your chest. Raise your chin back to the starting position. Tilt your head back as far as possible so you are looking up at the ceiling. Return your head to the starting position. - Independent  Head Tilt: Side to Side: Tilt your head to the side, bringing your ear toward your shoulder. Do not raise your shoulder to your ear. Keep your shoulder still. Return your head  to the starting position. Independent  Abdominal breathing - pt with emerging ability to reduced clavicular breathing   PATIENT EDUCATION: Education details: see above Person educated: Patient Education method: Explanation Education comprehension: needs further education   HOME EXERCISE PROGRAM: As above     GOALS: Goals reviewed with patient? Yes  SHORT TERM GOALS: Target date: 10 sessions  The patient will  decrease laryngeal and articulatory muscle tension by independently completing relaxation/stretching exercises.  Baseline: Goal status: INITIAL  2.  The patient will minimize vocal tension via resonant voice therapy (or comparable technique) with min SLP cues with 80% accuracy.  Baseline:  Goal status: INITIAL   LONG TERM GOALS: Target date: 05/28/2024  The patient will utilize a forward tone focused/resonant voice to decrease vocal hyperfunction and improve voice quality and vocal projection. Baseline:  Goal status: INITIAL  2.  Pt will understand and explain the changes in vocal folds that result from vocal abuse in 80% of opportunities across 3 data sessions.  Baseline:  Goal status: INITIAL  3.  The patient will increase hydration for an eventual goal of 6-8 glasses per day and limit caffeine intake (to maximum of 1-2, 8 oz cups/day), as measured by patient report.  Baseline:  Goal status: INITIAL   ASSESSMENT:  CLINICAL IMPRESSION: Patient is a 73 y.o. female who was seen today for a voice treatment. 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 Her vocal quality is c/b breathy, low pitch, raspy, rough and strained with vocal fatigue observed as session progressed. This is further complicated by decreased hydration, decreased understanding of LPR (poor PPI compliances a result), excessive vocal use.   Pt eager, responsive and reports that she started taking her PPI.   OBJECTIVE IMPAIRMENTS include voice disorder. These impairments are limiting patient from effectively communicating at home and in community. Factors affecting potential to achieve goals and functional outcome are untreated LPR. Patient will benefit from skilled SLP services to address above impairments and improve overall function.  REHAB POTENTIAL: Good  PLAN: SLP FREQUENCY: 1-2x/week  SLP DURATION: 8 weeks  PLANNED INTERVENTIONS: SLP instruction and feedback,  Compensatory strategies, and Patient/family education   Gabrielle Wakeland B. Rubbie, M.S., CCC-SLP, Tree Surgeon Certified Brain Injury Specialist [facility]  [facility] Rehabilitation Services Office [phone] Ascom [phone] Fax [phone]

## 2024-04-07 ENCOUNTER — Ambulatory Visit: Admitting: Speech Pathology

## 2024-04-07 DIAGNOSIS — R490 Dysphonia: Secondary | ICD-10-CM

## 2024-04-07 NOTE — Therapy (Signed)
 OUTPATIENT SPEECH LANGUAGE PATHOLOGY  VOICE TREATMENT NOTE    Patient Name: Pamela Mendoza MRN: [id number] DOB:1951-02-28, 73 y.o., female Today's Date: 04/07/2024   PCP: Alm Na, MD REFERRING PROVIDER: Carolee Hunter, MD   End of Session - 04/07/24 0941     Visit Number 3    Number of Visits 17    Date for Recertification  05/28/24    Authorization Type [employer] Blue [employer]    Progress Note Due on Visit 10    SLP Start Time 0935    SLP Stop Time  1015    SLP Time Calculation (min) 40 min    Activity Tolerance Patient tolerated treatment well            Past Medical History:  Diagnosis Date   Glaucoma (increased eye pressure) 2013   Hypercholesterolemia    Osteopenia 06/01/2009   L1-L4, FEMORAL NECK INC DENSITY ON 06/2011   Psoriasis    Past Surgical History:  Procedure Laterality Date   COLONOSCOPY  2012; 2019   WNL; Mar and Sept 2019 - one tubular adenoma with high grade dysplasia - rept 58yrs   EYE SURGERY  2021   VERICOSE VEIN STRIPPING     Patient Active Problem List   Diagnosis Date Noted   Glaucoma 03/07/2018   Psoriasis 03/07/2018   Hyperlipidemia 03/07/2018   Osteopenia 03/07/2018   Adenomatous colon polyp 03/06/2018    ONSET DATE:  ~ several months prior: date of referral 03/09/2024  REFERRING DIAG: R49.0 (ICD-10-CM) - Dysphonia   THERAPY DIAG:  Dysphonia  Rationale for Evaluation and Treatment Rehabilitation  SUBJECTIVE:   PERTINENT HISTORY and DIAGNOSTIC FINDINGS: Pt is a 73 year old female who was referred by her ENT, Dr Carolee Hunter, d/t hoarseness that is described as deep and raspy and moderate in severity with associated globus sensation.   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 No   FALLS: Has patient fallen in last 6 months? No,   LIVING ENVIRONMENT: Lives with: lives with their spouse Lives in: House/apartment  PLOF: Independent  PATIENT GOALS    to improve vocal quality  SUBJECTIVE STATEMENT: Pt pleasant, eger, I did my homework Pt accompanied by: self  OBJECTIVE:   TODAY'S TREATMENT:  Skilled treatment session targeted pt's dysphonia goals. SLP facilitated session by providing the following interventions:   Extensive education provided that Neck range of motion exercises should be done to the point of feeling a GENTLE, TOLERABLE stretch only. Demonstration provided with pt able to imitate for the following stretches:  Head Tilt: Forward and Back - Gently bow your head and try to touch your chin to your chest. Raise your chin back to the starting position. Tilt your head back as far as possible so you are looking up at the ceiling. Return your head to the starting position. - Independent  Head Tilt: Side to Side: Tilt your head to the side, bringing your ear toward your shoulder. Do not raise your shoulder to your ear. Keep your shoulder still. Return your head to the starting position. Independent  Abdominal breathing - pt with  emerging ability to reduced clavicular breathing  Resonant voice introduced   PATIENT EDUCATION: Education details: see above Person educated: Patient Education method: Explanation Education comprehension: needs further education   HOME EXERCISE PROGRAM: As above     GOALS: Goals reviewed with patient? Yes  SHORT TERM GOALS: Target date: 10 sessions  The patient will decrease laryngeal and articulatory muscle tension by independently completing relaxation/stretching  exercises.  Baseline: Goal status: INITIAL  2.  The patient will minimize vocal tension via resonant voice therapy (or comparable technique) with min SLP cues with 80% accuracy.  Baseline:  Goal status: INITIAL   LONG TERM GOALS: Target date: 05/28/2024  The patient will utilize a forward tone focused/resonant voice to decrease vocal hyperfunction and improve voice quality and vocal projection. Baseline:  Goal status: INITIAL  2.  Pt will understand and explain the changes in vocal folds that result from vocal abuse in 80% of opportunities across 3 data sessions.  Baseline:  Goal status: INITIAL  3.  The patient will increase hydration for an eventual goal of 6-8 glasses per day and limit caffeine intake (to maximum of 1-2, 8 oz cups/day), as measured by patient report.  Baseline:  Goal status: INITIAL   ASSESSMENT:  CLINICAL IMPRESSION: Patient is a 73 y.o. female who was seen today for a voice treatment. 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 Her vocal quality is c/b breathy, low pitch, raspy, rough and strained with vocal fatigue observed as session progressed. This is further complicated by decreased hydration, decreased understanding of LPR (poor PPI compliances a result), excessive vocal use.   Pt eager, responsive and reports that she started taking her PPI.   OBJECTIVE IMPAIRMENTS include voice disorder. These impairments are limiting patient from effectively communicating at home and in community. Factors affecting potential to achieve goals and functional outcome are untreated LPR. Patient will benefit from skilled SLP services to address above impairments and improve overall function.  REHAB POTENTIAL: Good  PLAN: SLP FREQUENCY: 1-2x/week  SLP DURATION: 8 weeks  PLANNED INTERVENTIONS: SLP instruction and feedback, Compensatory strategies, and Patient/family education  Hassell Roys, SLP Student Clinician  Happi B.  Rubbie, M.S., CCC-SLP, Tree Surgeon Certified Brain Injury Specialist [facility]  [facility] Rehabilitation Services Office [phone] Ascom [phone] Fax [phone]

## 2024-04-12 ENCOUNTER — Ambulatory Visit: Attending: Otolaryngology | Admitting: Speech Pathology

## 2024-04-12 DIAGNOSIS — R490 Dysphonia: Secondary | ICD-10-CM | POA: Diagnosis present

## 2024-04-12 NOTE — Therapy (Signed)
 OUTPATIENT SPEECH LANGUAGE PATHOLOGY  VOICE TREATMENT NOTE    Patient Name: Pamela Mendoza MRN: [id number] DOB:[birth year], 73 y.o., female Today's Date: 04/12/2024   PCP: Alm Na, MD REFERRING PROVIDER: Carolee Hunter, MD   End of Session - 04/12/24 1528     Visit Number 4    Number of Visits 17    Date for Recertification  05/28/24    Authorization Type Blue Cross Blue Shield    Progress Note Due on Visit 10    SLP Start Time 1530    SLP Stop Time  1600    SLP Time Calculation (min) 30 min    Activity Tolerance Patient tolerated treatment well            Past Medical History:  Diagnosis Date   Glaucoma (increased eye pressure) 2013   Hypercholesterolemia    Osteopenia 06/01/2009   L1-L4, FEMORAL NECK INC DENSITY ON 06/2011   Psoriasis    Past Surgical History:  Procedure Laterality Date   COLONOSCOPY  2012; 2019   WNL; Mar and Sept 2019 - one tubular adenoma with high grade dysplasia - rept 75yrs   EYE SURGERY  2021   VERICOSE VEIN STRIPPING     Patient Active Problem List   Diagnosis Date Noted   Glaucoma 03/07/2018   Psoriasis 03/07/2018   Hyperlipidemia 03/07/2018   Osteopenia 03/07/2018   Adenomatous colon polyp 03/06/2018    ONSET DATE:  ~ several months prior: date of referral 03/09/2024  REFERRING DIAG: R49.0 (ICD-10-CM) - Dysphonia   THERAPY DIAG:  Dysphonia  Rationale for Evaluation and Treatment Rehabilitation  SUBJECTIVE:   PERTINENT HISTORY and DIAGNOSTIC FINDINGS: Pt is a 73 year old female who was referred by her ENT, Dr Carolee Hunter, d/t hoarseness that is described as deep and raspy and moderate in severity with associated globus sensation.   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 No   FALLS: Has patient fallen in last 6 months? No,   LIVING ENVIRONMENT: Lives with: lives with their spouse Lives in: House/apartment  PLOF: Independent  PATIENT GOALS    to improve vocal quality  SUBJECTIVE STATEMENT: Pt pleasant, eager, I haven't been drinking any green tea Pt accompanied by: self  OBJECTIVE:   TODAY'S TREATMENT:  Skilled treatment session targeted pt's dysphonia goals. SLP facilitated session by providing the following interventions:   Extensive education provided that Neck range of motion exercises should be done to the point of feeling a GENTLE, TOLERABLE stretch only. Demonstration provided with pt able to imitate for the following stretches:  Head Tilt: Forward and Back - Gently bow your head and try to touch your chin to your chest. Raise your chin back to the starting position. Tilt your head back as far as possible so you are looking up at the ceiling. Return your head to the starting position. - Independent  Head Tilt: Side to Side: Tilt your head to the side, bringing your ear toward your shoulder. Do not raise your shoulder to your ear. Keep your shoulder still. Return your head to the starting position. Independent  Abdominal breathing -  pt benefited from initial maximal multimodal cues to pulse /s/   You have tension in your larynx limiting the air flow when you speak, resulting in a hoarse or gravelly voice. We call this pressed speech   We use flow phonation to improve proper air flow during speech. Sounds  that promote air flow include: s, z, sh, th, f, v, ch. We call this flow speech  Patient instructed in flow phonation through skill level 1: establish airflow release: Unarticulated: significant  improvement in duration of airflow.  Articulated (unvoiced): patient demonstrates improving mastery of maintaining airflow while moving articulators.  Initiated skill level 2-  Airflow + Voicing: Unarticulated:  having best response to voiced syllables.        PATIENT EDUCATION: Education details: see above Person educated: Patient Education method: Explanation Education comprehension: needs further education   HOME EXERCISE PROGRAM: As above     GOALS: Goals reviewed with patient? Yes  SHORT TERM GOALS: Target date: 10 sessions  The patient will decrease laryngeal and articulatory muscle tension by independently completing relaxation/stretching exercises.  Baseline: Goal status: INITIAL  2.  The patient will minimize vocal tension via resonant voice therapy (or comparable technique) with min SLP cues with 80% accuracy.  Baseline:  Goal status: INITIAL   LONG TERM GOALS: Target date: 05/28/2024  The patient will utilize a forward tone focused/resonant voice to decrease vocal hyperfunction and improve voice quality and vocal projection. Baseline:  Goal status: INITIAL  2.  Pt will understand and explain the changes in vocal folds that result from vocal abuse in 80% of opportunities across 3 data sessions.  Baseline:  Goal status: INITIAL  3.  The patient will increase hydration for an eventual goal of 6-8 glasses per day and limit caffeine intake (to maximum of 1-2, 8 oz cups/day), as measured by patient report.  Baseline:  Goal status: INITIAL   ASSESSMENT:  CLINICAL IMPRESSION: Patient is a 73 y.o. female who was seen today for a voice treatment. 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 Her vocal quality is c/b breathy, low pitch, raspy, rough and strained with vocal fatigue observed as session progressed. This is further complicated by decreased hydration, decreased understanding of LPR (poor PPI compliances a result),  excessive vocal use.   Pt eager, responsive and reports that she started taking her PPI.   OBJECTIVE IMPAIRMENTS include voice disorder. These impairments are limiting patient from effectively communicating at home and in community. Factors affecting potential to achieve goals and functional outcome are untreated LPR. Patient will benefit from skilled SLP services to address above impairments and improve overall function.  REHAB POTENTIAL: Good  PLAN: SLP FREQUENCY: 1-2x/week  SLP DURATION: 8 weeks  PLANNED INTERVENTIONS: SLP instruction and feedback, Compensatory strategies, and Patient/family education    Jemima Petko B. Rubbie, M.S., CCC-SLP, Tree Surgeon Certified Brain Injury Specialist [facility]  [facility] Rehabilitation Services Office [phone] Ascom [phone] Fax [phone]

## 2024-04-14 ENCOUNTER — Ambulatory Visit: Admitting: Speech Pathology

## 2024-04-14 DIAGNOSIS — R490 Dysphonia: Secondary | ICD-10-CM | POA: Diagnosis not present

## 2024-04-14 NOTE — Therapy (Signed)
 OUTPATIENT SPEECH LANGUAGE PATHOLOGY  VOICE TREATMENT NOTE    Patient Name: Pamela Mendoza MRN: [id number] DOB:06/01/1950, 73 y.o., female Today's Date: 04/14/2024   PCP: Alm Na, MD REFERRING PROVIDER: Carolee Hunter, MD   End of Session - 04/14/24 1704     Visit Number 5    Number of Visits 17    Date for Recertification  05/28/24    Authorization Type [employer] Blue [employer]    Progress Note Due on Visit 10    SLP Start Time 1530    SLP Stop Time  1600    SLP Time Calculation (min) 30 min    Activity Tolerance Patient tolerated treatment well            Past Medical History:  Diagnosis Date   Glaucoma (increased eye pressure) 2013   Hypercholesterolemia    Osteopenia 06/01/2009   L1-L4, FEMORAL NECK INC DENSITY ON 06/2011   Psoriasis    Past Surgical History:  Procedure Laterality Date   COLONOSCOPY  2012; 2019   WNL; Mar and Sept 2019 - one tubular adenoma with high grade dysplasia - rept 80yrs   EYE SURGERY  2021   VERICOSE VEIN STRIPPING     Patient Active Problem List   Diagnosis Date Noted   Glaucoma 03/07/2018   Psoriasis 03/07/2018   Hyperlipidemia 03/07/2018   Osteopenia 03/07/2018   Adenomatous colon polyp 03/06/2018    ONSET DATE:  ~ several months prior: date of referral 03/09/2024  REFERRING DIAG: R49.0 (ICD-10-CM) - Dysphonia   THERAPY DIAG:  Dysphonia  Rationale for Evaluation and Treatment Rehabilitation  SUBJECTIVE:   PERTINENT HISTORY and DIAGNOSTIC FINDINGS: Pt is a 73 year old female who was referred by her ENT, Dr Carolee Hunter, d/t hoarseness that is described as deep and raspy and moderate in severity with associated globus sensation.   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 No   FALLS: Has patient fallen in last 6 months? No,   LIVING ENVIRONMENT: Lives with: lives with their spouse Lives in: House/apartment  PLOF: Independent  PATIENT GOALS    to improve vocal quality  SUBJECTIVE STATEMENT: Pt pleasant, eager, I haven't been drinking any green tea Pt accompanied by: self  OBJECTIVE:   TODAY'S TREATMENT:  Skilled treatment session targeted pt's dysphonia goals. SLP facilitated session by providing the following interventions:   Extensive education provided that Neck range of motion exercises should be done to the point of feeling a GENTLE, TOLERABLE stretch only. Demonstration provided with pt able to imitate for the following stretches:  Head Tilt: Forward and Back - Gently bow your head and try to touch your chin to your chest. Raise your chin back to the starting position. Tilt your head back as far as possible so you are looking up at the ceiling. Return your head to the starting position. - Independent  Head Tilt: Side to Side: Tilt your head to the side, bringing your ear toward your shoulder. Do not raise your shoulder to your ear. Keep your shoulder still. Return your head to the starting position. Independent  Abdominal breathing -  Independent   Patient instructed in flow phonation through skill level 1: establish airflow release: Unarticulated: significant improvement in duration of airflow.  Articulated (unvoiced): patient demonstrates improving mastery of maintaining airflow while moving articulators.  skill level 2-  Airflow + Voicing: Unarticulated:  having best response to voiced syllables.  Independently achieved relaxed vocal quality when reading sentence level material, greatly improved  self-awareness of difference in vocal quality      PATIENT EDUCATION: Education details: see above Person educated: Patient Education method: Explanation Education comprehension: needs further education   HOME EXERCISE PROGRAM: As above     GOALS: Goals reviewed with patient? Yes  SHORT TERM GOALS: Target date: 10 sessions  The patient will decrease laryngeal and articulatory muscle tension by independently completing relaxation/stretching exercises.  Baseline: Goal status: INITIAL  2.  The patient will minimize vocal tension via resonant voice therapy (or comparable technique) with min SLP cues with 80% accuracy.  Baseline:  Goal status: INITIAL   LONG TERM GOALS: Target date: 05/28/2024  The patient will utilize a forward tone focused/resonant voice to decrease vocal hyperfunction and improve voice quality and vocal projection. Baseline:  Goal status: INITIAL  2.  Pt will understand and explain the changes in vocal folds that result from vocal abuse in 80% of opportunities across 3 data sessions.  Baseline:  Goal status: INITIAL  3.  The patient will increase hydration for an eventual goal of 6-8 glasses per day and limit caffeine intake (to maximum of 1-2, 8 oz cups/day), as measured by patient report.  Baseline:  Goal status: INITIAL   ASSESSMENT:  CLINICAL IMPRESSION: Patient is a 73 y.o. female who was seen today for a voice treatment. 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 Her vocal quality is c/b breathy, low pitch, raspy, rough and strained with vocal fatigue observed as session progressed. This is further complicated by decreased hydration, decreased understanding of LPR (poor PPI compliances a result), excessive vocal use.   Pt eager, responsive and reports that she started taking her PPI.   OBJECTIVE IMPAIRMENTS include voice disorder. These impairments are limiting patient from effectively communicating at  home and in community. Factors affecting potential to achieve goals and functional outcome are untreated LPR. Patient will benefit from skilled SLP services to address above impairments and improve overall function.  REHAB POTENTIAL: Good  PLAN: SLP FREQUENCY: 1-2x/week  SLP DURATION: 8 weeks  PLANNED INTERVENTIONS: SLP instruction and feedback, Compensatory strategies, and Patient/family education    Victory Dresden B. Rubbie, M.S., CCC-SLP, Tree Surgeon Certified Brain Injury Specialist [facility]  [facility] Rehabilitation Services Office [phone] Ascom [phone] Fax [phone]

## 2024-04-19 ENCOUNTER — Ambulatory Visit: Admitting: Speech Pathology

## 2024-04-19 DIAGNOSIS — R490 Dysphonia: Secondary | ICD-10-CM | POA: Diagnosis not present

## 2024-04-19 NOTE — Therapy (Signed)
 OUTPATIENT SPEECH LANGUAGE PATHOLOGY  VOICE TREATMENT NOTE    Patient Name: Pamela Mendoza MRN: [id number] DOB:16-Dec-1950, 73 y.o., female Today's Date: 04/19/2024   PCP: Alm Na, MD REFERRING PROVIDER: Carolee Hunter, MD   End of Session - 04/19/24 1143     Visit Number 6    Number of Visits 17    Date for Recertification  05/28/24    Authorization Type [employer] Blue Shield    Progress Note Due on Visit 10    SLP Start Time 1145    SLP Stop Time  1230    SLP Time Calculation (min) 45 min    Activity Tolerance Patient tolerated treatment well            Past Medical History:  Diagnosis Date   Glaucoma (increased eye pressure) 2013   Hypercholesterolemia    Osteopenia 06/01/2009   L1-L4, FEMORAL NECK INC DENSITY ON 06/2011   Psoriasis    Past Surgical History:  Procedure Laterality Date   COLONOSCOPY  2012; 2019   WNL; Mar and Sept 2019 - one tubular adenoma with high grade dysplasia - rept 18yrs   EYE SURGERY  2021   VERICOSE VEIN STRIPPING     Patient Active Problem List   Diagnosis Date Noted   Glaucoma 03/07/2018   Psoriasis 03/07/2018   Hyperlipidemia 03/07/2018   Osteopenia 03/07/2018   Adenomatous colon polyp 03/06/2018    ONSET DATE:  ~ several months prior: date of referral 03/09/2024  REFERRING DIAG: R49.0 (ICD-10-CM) - Dysphonia   THERAPY DIAG:  Dysphonia  Rationale for Evaluation and Treatment Rehabilitation  SUBJECTIVE:   PERTINENT HISTORY and DIAGNOSTIC FINDINGS: Pt is a 73 year old female who was referred by her ENT, Dr Carolee Hunter, d/t hoarseness that is described as deep and raspy and moderate in severity with associated globus sensation.   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 No   FALLS: Has patient fallen in last 6 months? No,   LIVING ENVIRONMENT: Lives with: lives with their spouse Lives in: House/apartment  PLOF: Independent  PATIENT GOALS    to improve vocal quality  SUBJECTIVE STATEMENT: Pt pleasant, eager, I am not as disciplined as I should be when referencing completion of HEP Pt accompanied by: self  OBJECTIVE:   TODAY'S TREATMENT:  Skilled treatment session targeted pt's dysphonia goals. SLP facilitated session by providing the following interventions:  Extensive education provided that Neck range of motion exercises should be done to the point of feeling a GENTLE, TOLERABLE stretch only. Demonstration provided with pt able to imitate for the following stretches:  Head Tilt: Forward and Back - Gently bow your head and try to touch your chin to your chest. Raise your chin back to the starting position. Tilt your head back as far as possible so you are looking up at the ceiling. Return your head to the starting position. - Independent  Head Tilt: Side to Side: Tilt your head to the side, bringing your ear toward your shoulder. Do not raise your shoulder to your ear. Keep your shoulder still. Return your head to the  starting position. Independent  Abdominal breathing - Independent  Pt struggled with increased tense phonation when performing structured flow phonation tasks HOWEVER pt able to read sentence length riddles without any vocal tension, progressed to reading sentence conversational questions. Min A verbal cues beneficial for promoting relaxed phonation when answering questions.   PATIENT EDUCATION: Education details: see above Person educated: Patient Education method: Explanation Education  comprehension: needs further education   HOME EXERCISE PROGRAM: As above     GOALS: Goals reviewed with patient? Yes  SHORT TERM GOALS: Target date: 10 sessions  The patient will decrease laryngeal and articulatory muscle tension by independently completing relaxation/stretching exercises.  Baseline: Goal status: INITIAL  2.  The patient will minimize vocal tension via resonant voice therapy (or comparable technique) with min SLP cues with 80% accuracy.  Baseline:  Goal status: INITIAL   LONG TERM GOALS: Target date: 05/28/2024  The patient will utilize a forward tone focused/resonant voice to decrease vocal hyperfunction and improve voice quality and vocal projection. Baseline:  Goal status: INITIAL  2.  Pt will understand and explain the changes in vocal folds that result from vocal abuse in 80% of opportunities across 3 data sessions.  Baseline:  Goal status: INITIAL  3.  The patient will increase hydration for an eventual goal of 6-8 glasses per day and limit caffeine intake (to maximum of 1-2, 8 oz cups/day), as measured by patient report.  Baseline:  Goal status: INITIAL   ASSESSMENT:  CLINICAL IMPRESSION: Patient is a 73 y.o. female who was seen today for a voice treatment. 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 Her vocal quality is c/b breathy, low pitch, raspy, rough and strained with vocal fatigue observed as session progressed. This is further complicated by decreased hydration, decreased understanding of LPR (poor PPI compliances a result), excessive vocal use.   Pt eager, responsive and reports that she started taking her PPI.   OBJECTIVE IMPAIRMENTS include voice disorder. These impairments are limiting patient from effectively communicating at home and in community. Factors affecting potential to achieve goals and functional outcome are untreated LPR. Patient will benefit from skilled SLP services to address above  impairments and improve overall function.  REHAB POTENTIAL: Good  PLAN: SLP FREQUENCY: 1-2x/week  SLP DURATION: 8 weeks  PLANNED INTERVENTIONS: SLP instruction and feedback, Compensatory strategies, and Patient/family education    Jeramy Dimmick B. Rubbie, M.S., CCC-SLP, Tree Surgeon Certified Brain Injury Specialist [facility]  [facility] Office [phone] Ascom [phone] Fax [phone]

## 2024-04-21 ENCOUNTER — Ambulatory Visit: Admitting: Speech Pathology

## 2024-04-21 DIAGNOSIS — R490 Dysphonia: Secondary | ICD-10-CM

## 2024-04-21 NOTE — Therapy (Signed)
 OUTPATIENT SPEECH LANGUAGE PATHOLOGY  VOICE TREATMENT NOTE    Patient Name: Pamela Mendoza MRN: [id number] DOB:[birth year], 73 y.o., female Today's Date: 04/21/2024   PCP: Alm Na, MD REFERRING PROVIDER: Carolee Hunter, MD   End of Session - 04/21/24 1740     Visit Number 7    Number of Visits 17    Date for Recertification  05/28/24    Authorization Type Blue [employer] Blue Shield    Progress Note Due on Visit 10    SLP Start Time 1530    SLP Stop Time  1615    SLP Time Calculation (min) 45 min    Activity Tolerance Patient tolerated treatment well            Past Medical History:  Diagnosis Date   Glaucoma (increased eye pressure) 2013   Hypercholesterolemia    Osteopenia 06/01/2009   L1-L4, FEMORAL NECK INC DENSITY ON 06/2011   Psoriasis    Past Surgical History:  Procedure Laterality Date   COLONOSCOPY  2012; 2019   WNL; Mar and Sept 2019 - one tubular adenoma with high grade dysplasia - rept 52yrs   EYE SURGERY  2021   VERICOSE VEIN STRIPPING     Patient Active Problem List   Diagnosis Date Noted   Glaucoma 03/07/2018   Psoriasis 03/07/2018   Hyperlipidemia 03/07/2018   Osteopenia 03/07/2018   Adenomatous colon polyp 03/06/2018    ONSET DATE:  ~ several months prior: date of referral 03/09/2024  REFERRING DIAG: R49.0 (ICD-10-CM) - Dysphonia   THERAPY DIAG:  Dysphonia  Rationale for Evaluation and Treatment Rehabilitation  SUBJECTIVE:   PERTINENT HISTORY and DIAGNOSTIC FINDINGS: Pt is a 73 year old female who was referred by her ENT, Dr Carolee Hunter, d/t hoarseness that is described as deep and raspy and moderate in severity with associated globus sensation.   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 No   FALLS: Has patient fallen in last 6 months? No,   LIVING ENVIRONMENT: Lives with: lives with their spouse Lives in: House/apartment  PLOF: Independent  PATIENT GOALS    to improve vocal quality  SUBJECTIVE STATEMENT: Pt pleasant, eager,  Pt accompanied by: self  OBJECTIVE:   TODAY'S TREATMENT:  Skilled treatment session targeted pt's dysphonia goals. SLP facilitated session by providing the following interventions:  Pt independently used relaxed voicing at the structured and unstructured conversation level for 30 minutes. Much improved.   PATIENT EDUCATION: Education details: see above Person educated: Patient Education method: Explanation Education comprehension: needs further education   HOME EXERCISE PROGRAM: As above     GOALS: Goals reviewed with patient? Yes  SHORT TERM GOALS: Target date: 10 sessions  The patient will decrease laryngeal and articulatory muscle tension by independently completing relaxation/stretching exercises.  Baseline: Goal status: INITIAL  2.  The patient will minimize vocal tension via resonant voice therapy (or comparable technique) with min SLP cues with 80% accuracy.  Baseline:  Goal status: INITIAL   LONG TERM GOALS: Target date: 05/28/2024  The patient will utilize a forward tone focused/resonant voice to decrease vocal hyperfunction and improve  voice quality and vocal projection. Baseline:  Goal status: INITIAL  2.  Pt will understand and explain the changes in vocal folds that result from vocal abuse in 80% of opportunities across 3 data sessions.  Baseline:  Goal status: INITIAL  3.  The patient will increase hydration for an eventual goal of 6-8 glasses per day and limit  caffeine intake (to maximum of 1-2, 8 oz cups/day), as measured by patient report.  Baseline:  Goal status: INITIAL   ASSESSMENT:  CLINICAL IMPRESSION: Patient is a 73 y.o. female who was seen today for a voice treatment. 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 Her vocal quality is c/b breathy, low pitch, raspy, rough and strained with vocal fatigue observed as session progressed. This is further complicated by decreased hydration, decreased understanding of LPR (poor PPI compliances a result), excessive vocal use.   Pt with great improvement, see the above treatment note for details.   OBJECTIVE IMPAIRMENTS include voice disorder. These impairments are limiting patient from effectively communicating at home and in community. Factors affecting potential to achieve goals and functional outcome are untreated LPR. Patient will benefit from skilled SLP services to address above impairments and improve overall function.  REHAB POTENTIAL: Good  PLAN: SLP FREQUENCY: 1-2x/week  SLP DURATION: 8 weeks  PLANNED INTERVENTIONS: SLP instruction and feedback, Compensatory strategies, and Patient/family education    Luwanda Starr B. Rubbie, M.S., CCC-SLP, Tree Surgeon Certified Brain Injury Specialist [facility]  [facility] Rehabilitation Services Office [phone] Ascom [phone] Fax [phone]

## 2024-04-26 ENCOUNTER — Ambulatory Visit: Admitting: Speech Pathology

## 2024-04-26 DIAGNOSIS — R490 Dysphonia: Secondary | ICD-10-CM

## 2024-04-26 NOTE — Therapy (Signed)
 OUTPATIENT SPEECH LANGUAGE PATHOLOGY  VOICE TREATMENT NOTE    Patient Name: Pamela Mendoza MRN: [id number] DOB:12/17/1950, 73 y.o., female Today's Date: 04/26/2024   PCP: Alm Na, MD REFERRING PROVIDER: Carolee Hunter, MD   End of Session - 04/26/24 1224     Visit Number 8    Number of Visits 17    Date for Recertification  05/28/24    Authorization Type Blue Cross Blue Shield    Progress Note Due on Visit 10    SLP Start Time 1145    SLP Stop Time  1230    SLP Time Calculation (min) 45 min    Activity Tolerance Patient tolerated treatment well            Past Medical History:  Diagnosis Date   Glaucoma (increased eye pressure) 2013   Hypercholesterolemia    Osteopenia 06/01/2009   L1-L4, FEMORAL NECK INC DENSITY ON 06/2011   Psoriasis    Past Surgical History:  Procedure Laterality Date   COLONOSCOPY  2012; 2019   WNL; Mar and Sept 2019 - one tubular adenoma with high grade dysplasia - rept 43yrs   EYE SURGERY  2021   VERICOSE VEIN STRIPPING     Patient Active Problem List   Diagnosis Date Noted   Glaucoma 03/07/2018   Psoriasis 03/07/2018   Hyperlipidemia 03/07/2018   Osteopenia 03/07/2018   Adenomatous colon polyp 03/06/2018    ONSET DATE:  ~ several months prior: date of referral 03/09/2024  REFERRING DIAG: R49.0 (ICD-10-CM) - Dysphonia   THERAPY DIAG:  Dysphonia  Rationale for Evaluation and Treatment Rehabilitation  SUBJECTIVE:   PERTINENT HISTORY and DIAGNOSTIC FINDINGS: Pt is a 73 year old female who was referred by her ENT, Dr Carolee Hunter, d/t hoarseness that is described as deep and raspy and moderate in severity with associated globus sensation.   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 No   FALLS: Has patient fallen in last 6 months? No,   LIVING ENVIRONMENT: Lives with: lives with their spouse Lives in: House/apartment  PLOF: Independent  PATIENT GOALS    to improve vocal quality  SUBJECTIVE STATEMENT: Pt pleasant, eager,  Pt accompanied by: self  OBJECTIVE:   TODAY'S TREATMENT:  Skilled treatment session targeted pt's dysphonia goals. SLP facilitated session by providing the following interventions:      Pt reports that her follow up appt with ENT I  s this Friday.   Pt independently used relaxed voicing at the structured and unstructured conversation level for 40 minutes while reading and answering Holiday trivia questions. Much improved.   PATIENT EDUCATION: Education details: see above Person educated: Patient Education method: Explanation Education comprehension: needs further education   HOME EXERCISE PROGRAM: As above     GOALS: Goals reviewed with patient? Yes  SHORT TERM GOALS: Target date: 10 sessions  The patient will decrease laryngeal and articulatory muscle tension by independently completing relaxation/stretching exercises.  Baseline: Goal status: INITIAL  2.  The patient will minimize vocal tension via resonant voice therapy (or comparable technique) with min SLP cues with 80% accuracy.  Baseline:  Goal status: INITIAL   LONG TERM GOALS: Target date: 05/28/2024  The patient will utilize a forward tone focused/resonant voice to decrease vocal hyperfunction and improve voice quality and vocal projection. Baseline:  Goal status: INITIAL  2.  Pt will understand and explain the changes in vocal folds that result from vocal abuse in 80% of opportunities across 3 data sessions.  Baseline:   Goal status: INITIAL  3.  The patient will increase hydration for an eventual goal of 6-8 glasses per day and limit caffeine intake (to maximum of 1-2, 8 oz cups/day), as measured by patient report.  Baseline:  Goal status: INITIAL   ASSESSMENT:  CLINICAL IMPRESSION: Patient is a 73 y.o. female who was seen today for a voice treatment. 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 Her vocal quality is c/b breathy, low pitch, raspy, rough and strained with vocal fatigue observed as session progressed. This is further complicated by decreased hydration, decreased understanding of LPR (poor PPI compliances a result), excessive vocal use.   Pt with great improvement, see the above treatment note for details.   OBJECTIVE IMPAIRMENTS include voice disorder. These impairments are limiting patient from effectively communicating at home and in community. Factors affecting potential to achieve goals and functional outcome are untreated LPR. Patient will benefit from skilled SLP services to address above impairments and improve overall function.  REHAB POTENTIAL: Good  PLAN: SLP FREQUENCY: 1-2x/week  SLP DURATION: 8 weeks  PLANNED INTERVENTIONS: SLP instruction and feedback, Compensatory strategies, and Patient/family education    Raynard Mapps B. Rubbie, M.S., CCC-SLP, Tree Surgeon Certified Brain Injury Specialist [facility]  [facility] Rehabilitation Services Office [phone] Ascom [phone] Fax [phone]

## 2024-04-28 ENCOUNTER — Ambulatory Visit: Admitting: Speech Pathology

## 2024-04-28 DIAGNOSIS — R490 Dysphonia: Secondary | ICD-10-CM

## 2024-04-28 NOTE — Therapy (Signed)
 OUTPATIENT SPEECH LANGUAGE PATHOLOGY  VOICE TREATMENT NOTE    Patient Name: Pamela Mendoza MRN: [id number] DOB:[birth year], 73 y.o., female Today's Date: 04/28/2024   PCP: Alm Na, MD REFERRING PROVIDER: Carolee Hunter, MD   End of Session - 04/28/24 1615     Visit Number 9    Number of Visits 17    Date for Recertification  05/28/24    Authorization Type [employer] Blue [employer]    Progress Note Due on Visit 10    SLP Start Time 1530    SLP Stop Time  1610    SLP Time Calculation (min) 40 min    Activity Tolerance Patient tolerated treatment well            Past Medical History:  Diagnosis Date   Glaucoma (increased eye pressure) 2013   Hypercholesterolemia    Osteopenia 06/01/2009   L1-L4, FEMORAL NECK INC DENSITY ON 06/2011   Psoriasis    Past Surgical History:  Procedure Laterality Date   COLONOSCOPY  2012; 2019   WNL; Mar and Sept 2019 - one tubular adenoma with high grade dysplasia - rept 9yrs   EYE SURGERY  2021   VERICOSE VEIN STRIPPING     Patient Active Problem List   Diagnosis Date Noted   Glaucoma 03/07/2018   Psoriasis 03/07/2018   Hyperlipidemia 03/07/2018   Osteopenia 03/07/2018   Adenomatous colon polyp 03/06/2018    ONSET DATE:  ~ several months prior: date of referral 03/09/2024  REFERRING DIAG: R49.0 (ICD-10-CM) - Dysphonia   THERAPY DIAG:  Dysphonia  Rationale for Evaluation and Treatment Rehabilitation  SUBJECTIVE:   PERTINENT HISTORY and DIAGNOSTIC FINDINGS: Pt is a 73 year old female who was referred by her ENT, Dr Carolee Hunter, d/t hoarseness that is described as deep and raspy and moderate in severity with associated globus sensation.   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 No   FALLS: Has patient fallen in last 6 months? No,   LIVING ENVIRONMENT: Lives with: lives with their spouse Lives in: House/apartment  PLOF: Independent  PATIENT GOALS    to improve vocal quality  SUBJECTIVE STATEMENT: Pt pleasant, eager,  Pt accompanied by: self  OBJECTIVE:   TODAY'S TREATMENT:  Skilled treatment session targeted pt's dysphonia goals. SLP facilitated session by providing the following interventions:      Pt reports that her follow up appt with ENT is this Friday.   Pt struggled during initial portion of session with intermittent hoarse/gravely vocal quality. With continued effort and Min A to Mod A pt able to produce relaxed voicing that was much improved. This continued for the remainder of session (~20 minutes).   PATIENT EDUCATION: Education details: see above Person educated: Patient Education method: Explanation Education comprehension: needs further education   HOME EXERCISE PROGRAM: As above     GOALS: Goals reviewed with patient? Yes  SHORT TERM GOALS: Target date: 10 sessions  The patient will decrease laryngeal and articulatory muscle tension by independently completing relaxation/stretching exercises.  Baseline: Goal status: INITIAL  2.  The patient will minimize vocal tension via resonant voice  therapy (or comparable technique) with min SLP cues with 80% accuracy.  Baseline:  Goal status: INITIAL   LONG TERM GOALS: Target date: 05/28/2024  The patient will utilize a forward tone focused/resonant voice to decrease vocal hyperfunction and improve voice quality and vocal projection. Baseline:  Goal status: INITIAL  2.  Pt will understand and explain the changes in vocal folds that result  from vocal abuse in 80% of opportunities across 3 data sessions.  Baseline:  Goal status: INITIAL  3.  The patient will increase hydration for an eventual goal of 6-8 glasses per day and limit caffeine intake (to maximum of 1-2, 8 oz cups/day), as measured by patient report.  Baseline:  Goal status: INITIAL   ASSESSMENT:  CLINICAL IMPRESSION: Patient is a 73 y.o. female who was seen today for a voice treatment. 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 Her vocal quality is c/b breathy, low pitch, raspy, rough and strained with vocal fatigue observed as session progressed. This is further complicated by decreased hydration, decreased understanding of LPR (poor PPI compliances a result), excessive vocal use.   Pt with great improvement, see the above treatment note for details.   OBJECTIVE IMPAIRMENTS include voice disorder. These impairments are limiting patient from effectively communicating at home and in community. Factors affecting potential to achieve goals and functional outcome are untreated LPR. Patient will benefit from skilled SLP services to address above impairments and improve overall function.  REHAB POTENTIAL: Good  PLAN: SLP FREQUENCY: 1-2x/week  SLP DURATION: 8 weeks  PLANNED INTERVENTIONS: SLP instruction and feedback, Compensatory strategies, and Patient/family education    Christeen Lai B. Rubbie, M.S., CCC-SLP, Tree Surgeon Certified Brain Injury Specialist [facility]  [facility] Rehabilitation Services Office [phone] Ascom [phone] Fax [phone]

## 2024-05-03 ENCOUNTER — Ambulatory Visit: Admitting: Speech Pathology

## 2024-05-04 ENCOUNTER — Ambulatory Visit: Admitting: Speech Pathology

## 2024-05-10 ENCOUNTER — Ambulatory Visit: Admitting: Speech Pathology

## 2024-05-10 DIAGNOSIS — R490 Dysphonia: Secondary | ICD-10-CM

## 2024-05-10 NOTE — Therapy (Signed)
 " OUTPATIENT SPEECH LANGUAGE PATHOLOGY  VOICE TREATMENT NOTE 10th VISIT PROGRESS NOTE    Patient Name: Pamela Mendoza MRN: [id number] DOB:11/02/1950, 73 y.o., female Today's Date: 05/10/2024   PCP: Alm Na, MD REFERRING PROVIDER: Carolee Hunter, MD  Speech Therapy Progress Note  Dates of Reporting Period: 04/02/2024 to 05/10/2024  Objective: Patient has been seen for 10 speech therapy sessions this reporting period targeting her moderate dysphonia. Patient is making progress toward LTGs and met 3 STGs this reporting period. See skilled intervention, clinical impressions, and goals below for details.    End of Session - 05/10/24 1728     Visit Number 10    Number of Visits 17    Date for Recertification  05/28/24    Authorization Type Blue [employer] Blue Shield    Progress Note Due on Visit 10    SLP Start Time 1530    SLP Stop Time  1620    SLP Time Calculation (min) 50 min    Activity Tolerance Patient tolerated treatment well            Past Medical History:  Diagnosis Date   Glaucoma (increased eye pressure) 2013   Hypercholesterolemia    Osteopenia 06/01/2009   L1-L4, FEMORAL NECK INC DENSITY ON 06/2011   Psoriasis    Past Surgical History:  Procedure Laterality Date   COLONOSCOPY  2012; 2019   WNL; Mar and Sept 2019 - one tubular adenoma with high grade dysplasia - rept 27yrs   EYE SURGERY  2021   VERICOSE VEIN STRIPPING     Patient Active Problem List   Diagnosis Date Noted   Glaucoma 03/07/2018   Psoriasis 03/07/2018   Hyperlipidemia 03/07/2018   Osteopenia 03/07/2018   Adenomatous colon polyp 03/06/2018    ONSET DATE:  ~ several months prior: date of referral 03/09/2024  REFERRING DIAG: R49.0 (ICD-10-CM) - Dysphonia   THERAPY DIAG:  Dysphonia  Rationale for Evaluation and Treatment Rehabilitation  SUBJECTIVE:   PERTINENT HISTORY and DIAGNOSTIC FINDINGS: Pt is a 73 year old female who was referred by her ENT, Dr Carolee Hunter, d/t  hoarseness that is described as deep and raspy and moderate in severity with associated globus sensation.   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 No   FALLS: Has patient fallen in last 6 months? No,   LIVING ENVIRONMENT: Lives with: lives with their spouse Lives in: House/apartment  PLOF: Independent  PATIENT GOALS    to improve vocal quality  SUBJECTIVE STATEMENT: Pt pleasant, eager,  Pt accompanied by: self  OBJECTIVE:   TODAY'S TREATMENT:  Skilled treatment session targeted pt's dysphonia goals. SLP facilitated session by providing the following interventions:      Pt reports that ENT stated pt demonstrated improvement over previous appt. This information was also confirmed via secure chat.     Pt with continued improve vocal quality over 50 minute semi-complex unstructured conversation. Education provided on ways to transition improved vocal quality into unstructured situations outside of skilled ST sessions.   PATIENT EDUCATION: Education details: see above Person educated: Patient Education method: Explanation Education comprehension: needs further  education   HOME EXERCISE PROGRAM: As above     GOALS: Goals reviewed with patient? Yes  SHORT TERM GOALS: Target date: 10 sessions  Updated: 05/10/2024 The patient will decrease laryngeal and articulatory muscle tension by independently completing relaxation/stretching exercises.  Baseline: Goal status: INITIAL: MET  2.  The patient  will minimize vocal tension via resonant voice therapy (or comparable technique) with min SLP cues with 80% accuracy.  Baseline:  Goal status: INITIAL: MET - upgraded to with Mod I, pt will minimize vocal tension via resonant voice therapy (or comparable technique) with 80% accuracy.    LONG TERM GOALS: Target date: 05/28/2024  Updated: 05/10/2024 The patient will utilize a forward tone focused/resonant voice to decrease vocal hyperfunction and improve voice quality and vocal projection. Baseline:  Goal status: INITIAL: great progress made  2.  Pt will understand and explain the changes in vocal folds that result from vocal abuse in 80% of opportunities across 3 data sessions.  Baseline:  Goal status: INITIAL: great progress made  3.  The patient will increase hydration for an eventual goal of 6-8 glasses per day and limit caffeine intake (to maximum of 1-2, 8 oz cups/day), as measured by patient report.  Baseline:  Goal status: INITIAL: MET   ASSESSMENT:  CLINICAL IMPRESSION: Patient is a 73 y.o. female who was seen today for a voice treatment. 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 Her vocal quality is c/b breathy, low pitch, raspy, rough and strained with vocal fatigue observed as session progressed. This is further complicated by decreased hydration, decreased understanding of LPR (poor PPI compliances a result), excessive vocal use.   Pt with great improvement, see the above treatment note for details.   OBJECTIVE IMPAIRMENTS include voice disorder. These impairments are limiting patient from effectively communicating at home and in community. Factors affecting potential to achieve goals and functional outcome are untreated LPR. Patient will benefit from skilled SLP services to address above impairments and improve overall function.  REHAB POTENTIAL: Good  PLAN: SLP FREQUENCY: 1-2x/week  SLP DURATION: 8 weeks  PLANNED INTERVENTIONS:  SLP instruction and feedback, Compensatory strategies, and Patient/family education    Ebert Forrester B. Rubbie, M.S., CCC-SLP, CBIS Speech-Language Pathologist Certified Brain Injury Specialist [facility]  [facility] [phone] Ascom [phone] Fax [phone]  "

## 2024-05-14 ENCOUNTER — Ambulatory Visit: Attending: Otolaryngology | Admitting: Speech Pathology

## 2024-05-14 DIAGNOSIS — R490 Dysphonia: Secondary | ICD-10-CM | POA: Insufficient documentation

## 2024-05-14 NOTE — Therapy (Signed)
 " OUTPATIENT SPEECH LANGUAGE PATHOLOGY  VOICE TREATMENT NOTE DISCHARGE SUMMARY    Patient Name: Pamela Mendoza MRN: [id number] DOB:1951/04/07, 74 y.o., female Today's Date: 05/14/2024   PCP: Alm Na, MD REFERRING PROVIDER: Carolee Hunter, MD   End of Session - 05/14/24 1622     Visit Number 11    Number of Visits 17    Date for Recertification  05/28/24    Authorization Type Blue Cross Blue Shield    Progress Note Due on Visit 20    SLP Start Time [phone]    SLP Stop Time  0820    SLP Time Calculation (min) 30 min    Activity Tolerance Patient tolerated treatment well            Past Medical History:  Diagnosis Date   Glaucoma (increased eye pressure) 2013   Hypercholesterolemia    Osteopenia 06/01/2009   L1-L4, FEMORAL NECK INC DENSITY ON 06/2011   Psoriasis    Past Surgical History:  Procedure Laterality Date   COLONOSCOPY  2012; 2019   WNL; Mar and Sept 2019 - one tubular adenoma with high grade dysplasia - rept 65yrs   EYE SURGERY  2021   VERICOSE VEIN STRIPPING     Patient Active Problem List   Diagnosis Date Noted   Glaucoma 03/07/2018   Psoriasis 03/07/2018   Hyperlipidemia 03/07/2018   Osteopenia 03/07/2018   Adenomatous colon polyp 03/06/2018    ONSET DATE:  ~ several months prior: date of referral 03/09/2024  REFERRING DIAG: R49.0 (ICD-10-CM) - Dysphonia   THERAPY DIAG:  Dysphonia  Rationale for Evaluation and Treatment Rehabilitation  SUBJECTIVE:   PERTINENT HISTORY and DIAGNOSTIC FINDINGS: Pt is a 74 year old female who was referred by her ENT, Dr Carolee Hunter, d/t hoarseness that is described as deep and raspy and moderate in severity with associated globus sensation.   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 No   FALLS: Has patient fallen in last 6 months? No,   LIVING ENVIRONMENT: Lives with: lives with their spouse Lives in: House/apartment  PLOF: Independent  PATIENT GOALS    to improve vocal quality  SUBJECTIVE STATEMENT: Pt pleasant, eager,  Pt accompanied by: self  OBJECTIVE:   TODAY'S TREATMENT:  Skilled treatment session targeted pt's dysphonia goals. SLP facilitated session by providing the following interventions:    Education completed in preparation for discharge including continuing HEP targeting relaxed phonation, neck stretches, ways to reduced vocal tension when working out. Pt voiced understanding and is currently Mod I for relaxed phonation during 30 minute conversation.   PATIENT EDUCATION: Education details: see above Person educated: Patient Education method: Explanation Education comprehension: needs further education   HOME EXERCISE PROGRAM: As above     GOALS: Goals reviewed with patient? Yes  SHORT TERM GOALS: Target date: 10 sessions  Updated: 05/10/2024 The patient will decrease laryngeal and articulatory muscle tension by independently completing relaxation/stretching exercises.  Baseline: Goal status: INITIAL: MET  2.  The patient will minimize vocal tension via resonant voice therapy (or comparable technique) with min SLP cues with 80% accuracy.  Baseline:  Goal status: INITIAL: MET - upgraded to with Mod I, pt will minimize vocal tension via resonant voice therapy (or comparable technique) with 80% accuracy. MET   LONG TERM GOALS: Target date: 05/28/2024  Updated: 05/10/2024 The patient will utilize a forward tone focused/resonant voice to decrease vocal hyperfunction and  improve voice quality and vocal projection. Baseline:  Goal status: INITIAL: great progress made: MET  2.  Pt will understand and explain the changes in vocal folds that result from vocal abuse in 80% of opportunities across 3 data sessions.  Baseline:  Goal status: INITIAL: great progress made: MET  3.  The patient will increase hydration for an eventual goal of 6-8 glasses per day and limit caffeine intake (to maximum of 1-2, 8 oz cups/day), as measured by patient report.  Baseline:  Goal status: INITIAL: MET   ASSESSMENT:  CLINICAL IMPRESSION: Patient is a 74 y.o. female who was seen today for a voice treatment. She has made great progress over the course of ST sessions and has a result she has improved vocal quality at the conversation level. She has also increased her knowledge of voice, vocal tension and phono-traumatic vocal behaviors. At this time, she is appropriate for discharge from skilled ST services.   Sherlyn Ebbert B. Rubbie, M.S., CCC-SLP, CBIS Speech-Language Pathologist Certified Brain Injury Specialist [facility]  [facility] [phone] Ascom [phone] Fax [phone]  "

## 2024-05-17 ENCOUNTER — Ambulatory Visit: Admitting: Speech Pathology

## 2024-05-21 ENCOUNTER — Ambulatory Visit: Admitting: Speech Pathology

## 2024-05-24 ENCOUNTER — Ambulatory Visit

## 2024-05-27 ENCOUNTER — Ambulatory Visit: Admitting: Speech Pathology

## 2024-05-31 ENCOUNTER — Ambulatory Visit: Admitting: Speech Pathology

## 2024-06-02 ENCOUNTER — Ambulatory Visit: Admitting: Speech Pathology

## 2024-06-07 ENCOUNTER — Ambulatory Visit: Admitting: Speech Pathology
